# Patient Record
Sex: Female | Born: 1980
Health system: Southern US, Community
[De-identification: ages and names within clinical notes are randomized; demographics above are authoritative.]

## PROBLEM LIST (undated history)

## (undated) DIAGNOSIS — I10 Essential (primary) hypertension: Secondary | ICD-10-CM

## (undated) DIAGNOSIS — B019 Varicella without complication: Secondary | ICD-10-CM

## (undated) DIAGNOSIS — F419 Anxiety disorder, unspecified: Secondary | ICD-10-CM

## (undated) DIAGNOSIS — K219 Gastro-esophageal reflux disease without esophagitis: Secondary | ICD-10-CM

## (undated) DIAGNOSIS — G43909 Migraine, unspecified, not intractable, without status migrainosus: Secondary | ICD-10-CM

## (undated) DIAGNOSIS — F32A Depression, unspecified: Secondary | ICD-10-CM

## (undated) DIAGNOSIS — F329 Major depressive disorder, single episode, unspecified: Secondary | ICD-10-CM

## (undated) DIAGNOSIS — R51 Headache: Secondary | ICD-10-CM

## (undated) DIAGNOSIS — R519 Headache, unspecified: Secondary | ICD-10-CM

## (undated) DIAGNOSIS — T7840XA Allergy, unspecified, initial encounter: Secondary | ICD-10-CM

## (undated) DIAGNOSIS — M797 Fibromyalgia: Secondary | ICD-10-CM

## (undated) HISTORY — DX: Migraine, unspecified, not intractable, without status migrainosus: G43.909

## (undated) HISTORY — DX: Varicella without complication: B01.9

## (undated) HISTORY — DX: Major depressive disorder, single episode, unspecified: F32.9

## (undated) HISTORY — DX: Headache: R51

## (undated) HISTORY — DX: Headache, unspecified: R51.9

## (undated) HISTORY — DX: Fibromyalgia: M79.7

## (undated) HISTORY — DX: Depression, unspecified: F32.A

## (undated) HISTORY — DX: Allergy, unspecified, initial encounter: T78.40XA

---

## 2007-03-30 LAB — HM PAP SMEAR: HM PAP: NORMAL

## 2007-05-04 ENCOUNTER — Emergency Department: Payer: Self-pay | Admitting: Emergency Medicine

## 2008-02-28 ENCOUNTER — Observation Stay: Payer: Self-pay | Admitting: Obstetrics and Gynecology

## 2008-06-30 ENCOUNTER — Ambulatory Visit: Payer: Self-pay | Admitting: Obstetrics and Gynecology

## 2008-07-03 ENCOUNTER — Inpatient Hospital Stay: Payer: Self-pay

## 2009-03-31 ENCOUNTER — Emergency Department: Payer: Self-pay | Admitting: Emergency Medicine

## 2013-03-03 ENCOUNTER — Emergency Department: Payer: Self-pay | Admitting: Emergency Medicine

## 2013-03-06 ENCOUNTER — Emergency Department: Payer: Self-pay | Admitting: Emergency Medicine

## 2013-03-10 ENCOUNTER — Emergency Department: Payer: Self-pay

## 2013-03-17 ENCOUNTER — Emergency Department: Payer: Self-pay | Admitting: Emergency Medicine

## 2015-03-30 ENCOUNTER — Ambulatory Visit (INDEPENDENT_AMBULATORY_CARE_PROVIDER_SITE_OTHER): Payer: BLUE CROSS/BLUE SHIELD | Admitting: Nurse Practitioner

## 2015-03-30 ENCOUNTER — Encounter: Payer: Self-pay | Admitting: Nurse Practitioner

## 2015-03-30 VITALS — BP 124/68 | HR 93 | Temp 98.1°F | Resp 14 | Ht 63.75 in | Wt 228.8 lb

## 2015-03-30 DIAGNOSIS — R519 Headache, unspecified: Secondary | ICD-10-CM | POA: Insufficient documentation

## 2015-03-30 DIAGNOSIS — F418 Other specified anxiety disorders: Secondary | ICD-10-CM | POA: Insufficient documentation

## 2015-03-30 DIAGNOSIS — G43809 Other migraine, not intractable, without status migrainosus: Secondary | ICD-10-CM | POA: Diagnosis not present

## 2015-03-30 DIAGNOSIS — J019 Acute sinusitis, unspecified: Secondary | ICD-10-CM | POA: Insufficient documentation

## 2015-03-30 DIAGNOSIS — R51 Headache: Secondary | ICD-10-CM | POA: Diagnosis not present

## 2015-03-30 DIAGNOSIS — J0110 Acute frontal sinusitis, unspecified: Secondary | ICD-10-CM | POA: Diagnosis not present

## 2015-03-30 DIAGNOSIS — Z7689 Persons encountering health services in other specified circumstances: Secondary | ICD-10-CM | POA: Insufficient documentation

## 2015-03-30 DIAGNOSIS — J302 Other seasonal allergic rhinitis: Secondary | ICD-10-CM | POA: Insufficient documentation

## 2015-03-30 DIAGNOSIS — Z7189 Other specified counseling: Secondary | ICD-10-CM | POA: Diagnosis not present

## 2015-03-30 DIAGNOSIS — G43909 Migraine, unspecified, not intractable, without status migrainosus: Secondary | ICD-10-CM | POA: Insufficient documentation

## 2015-03-30 DIAGNOSIS — F329 Major depressive disorder, single episode, unspecified: Secondary | ICD-10-CM

## 2015-03-30 DIAGNOSIS — F32A Depression, unspecified: Secondary | ICD-10-CM

## 2015-03-30 MED ORDER — METHYLPREDNISOLONE 4 MG PO TABS: ORAL_TABLET | ORAL | Status: DC

## 2015-03-30 NOTE — Assessment & Plan Note (Signed)
Stable. Pt reports she takes medication as needed like flonase during flares. Will follow.

## 2015-03-30 NOTE — Assessment & Plan Note (Signed)
2 months of symptoms. Pt has been treated twice for OME right and left ear. Prednisone taper sent to pharmacy. Instructions on use and what not to take with it given to pt verbally and on AVS. FU prn worsening/failure to improve.

## 2015-03-30 NOTE — Assessment & Plan Note (Signed)
Stable currently without medication. Pt practices mindfulness. Has taken Effexor in past. Will follow.

## 2015-03-30 NOTE — Progress Notes (Signed)
Subjective:    Patient ID: Tiffany Jackson, female    DOB: 1981/04/03, 34 y.o.   MRN: [id number]  HPI  Tiffany Jackson is a 35 yo female establishing care today and CC of sinus issues x 2 months.   1) New pt info:   Immunizations- tdap 2008  Pap- 2008- Needs updated   Eye Exam- 2015, wears glasses   Dental Exam- 2 weeks ago for tooth pulling   LMP- 2 months ago, irregular, tubal ligation 6 years ago   2) Chronic Problems-  Depression/Anxiety- Effexor in the past   Headaches- twice a week, take OTC medication which is helpful   Migraines- family history, monthly, photophobia, usually lasts around 1 day   Allergies- Loratadine 10 mg- not helpful     3) Acute Problems- ER in [place]- chest pain last week, EKG, blood work all negative  [facility]- OME right ear- abx, went back and got it in the left ear    2.5-3 weeks ago this occurred  Sinuses- swimmy headed, pressure, queasy   Sudafed  Review of Systems  Constitutional: Negative for fever, chills, diaphoresis and fatigue.  HENT: Positive for ear pain, postnasal drip and sinus pressure. Negative for congestion, ear discharge, rhinorrhea, sore throat, tinnitus and trouble swallowing.   Respiratory: Positive for cough. Negative for chest tightness and wheezing.   Cardiovascular: Negative for chest pain, palpitations and leg swelling.  Gastrointestinal: Positive for nausea. Negative for vomiting and diarrhea.       Nausea from drainage  Skin: Negative for rash.  Neurological: Positive for light-headedness and headaches. Negative for dizziness.   Past Medical History  Diagnosis Date  . Depression   . Allergy     Seasonal  . Chicken pox   . Frequent headaches   . Migraines     History   Social History  . Marital Status: Married    Spouse Name: N/A  . Number of Children: N/A  . Years of Education: N/A   Occupational History  . Not on file.   Social History Main Topics  . Smoking status: Former Smoker    Types: Cigarettes    Quit  date: 07/02/2013  . Smokeless tobacco: Never Used  . Alcohol Use: No  . Drug Use: No  . Sexual Activity:    Partners: Male    Birth Control/ Protection: Surgical     Comment: Husband   Other Topics Concern  . 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 Cesarean section      2003, 2005, 2009    Family History  Problem Relation Age of Onset  . Multiple sclerosis Mother   . Hypertension Father   . Cancer Maternal Grandmother     Breast Cancer  . Cancer Paternal Grandfather     Lung Cancer  . Heart disease Daughter     No Known Allergies  No current outpatient prescriptions on file prior to visit.   No current facility-administered medications on file prior to visit.      Objective:   Physical Exam  Constitutional: She is oriented to person, place, and time. She appears well-developed and well-nourished. No distress.  BP 124/68 mmHg  Pulse 93  Temp(Src) 98.1  F (36.7 C)  Resp 14  Ht 5' 3.75" (1.619 m)  Wt 228 lb 12.8 oz (103.783 kg)  BMI 39.59 kg/m2  SpO2 99%    HENT:  Head: Normocephalic and atraumatic.  Right Ear: External ear normal.  Left Ear: External ear normal.  Mouth/Throat: No oropharyngeal exudate.  TM's bulging and injected bilaterally, Landmarks visible  Eyes: EOM are normal. Pupils are equal, round, and reactive to light. Right eye exhibits no discharge. Left eye exhibits no discharge. No scleral icterus.  Neck: Normal range of motion. Neck supple. No thyromegaly present.  Cardiovascular: Normal rate, regular rhythm and normal heart sounds.   Pulmonary/Chest: Effort normal and breath sounds normal.  Lymphadenopathy:    She has no cervical adenopathy.  Neurological: She is alert and oriented to person, place, and time.  Skin: Skin is warm  and dry. No rash noted. She is not diaphoretic.  Psychiatric: She has a normal mood and affect. Her behavior is normal. Judgment and thought content normal.      Assessment & Plan:

## 2015-03-30 NOTE — Assessment & Plan Note (Signed)
Mild headaches 2-3 x a week that resolve with OTC treatment.

## 2015-03-30 NOTE — Assessment & Plan Note (Addendum)
Discussed acute and chronic issues. Reviewed health maintenance measures, PFSHx, and immunizations. Obtain records from previous facility  Pt needs physical with updated GYN exam

## 2015-03-30 NOTE — Assessment & Plan Note (Signed)
Family history, pt also has at least once monthly. She gets photophobia and it usually lasts for 24 hours. Rest and NSAIDs helpful.

## 2015-03-30 NOTE — Patient Instructions (Signed)
Prednisone with breakfast  6 tablets on day 1, 5 tablets on day 2, 4 tablets on day 3, 3 tablets on day 4, 2 tablets day 5, 1 tablet on day 6...done!   No NSAIDs (ibuprofen ect...)   Call us next week if not helpful.

## 2015-03-30 NOTE — Progress Notes (Signed)
Pre visit review using our clinic review tool, if applicable. No additional management support is needed unless otherwise documented below in the visit note. 

## 2015-03-31 ENCOUNTER — Emergency Department
Admission: EM | Admit: 2015-03-31 | Discharge: 2015-04-01 | Disposition: A | Discharge status: Home / Self Care | Payer: BLUE CROSS/BLUE SHIELD | Attending: Emergency Medicine | Admitting: Emergency Medicine

## 2015-03-31 ENCOUNTER — Encounter: Payer: Self-pay | Admitting: Emergency Medicine

## 2015-03-31 ENCOUNTER — Emergency Department: Payer: BLUE CROSS/BLUE SHIELD

## 2015-03-31 DIAGNOSIS — Z7952 Long term (current) use of systemic steroids: Secondary | ICD-10-CM | POA: Diagnosis not present

## 2015-03-31 DIAGNOSIS — Z87891 Personal history of nicotine dependence: Secondary | ICD-10-CM | POA: Insufficient documentation

## 2015-03-31 DIAGNOSIS — R000 Tachycardia, unspecified: Secondary | ICD-10-CM | POA: Diagnosis not present

## 2015-03-31 DIAGNOSIS — R079 Chest pain, unspecified: Secondary | ICD-10-CM | POA: Diagnosis present

## 2015-03-31 DIAGNOSIS — R002 Palpitations: Secondary | ICD-10-CM | POA: Diagnosis not present

## 2015-03-31 LAB — BASIC METABOLIC PANEL
ANION GAP: 12 (ref 5–15)
BUN: 7 mg/dL (ref 6–20)
CHLORIDE: 106 mmol/L (ref 101–111)
CO2: 20 mmol/L — AB (ref 22–32)
Calcium: 9.7 mg/dL (ref 8.9–10.3)
Creatinine, Ser: 0.54 mg/dL (ref 0.44–1.00)
GFR calc non Af Amer: >60 mL/min (ref >60)
Glucose, Bld: 127 mg/dL — ABNORMAL HIGH (ref 65–99)
POTASSIUM: 3.7 mmol/L (ref 3.5–5.1)
SODIUM: 138 mmol/L (ref 135–145)

## 2015-03-31 LAB — CBC WITH DIFFERENTIAL/PLATELET
BASOS PCT: 0 %
Basophils Absolute: 0 10*3/uL (ref 0–0.1)
Eosinophils Absolute: 0 10*3/uL (ref 0–0.7)
Eosinophils Relative: 0 %
HCT: 45.3 % (ref 35.0–47.0)
Hemoglobin: 14.8 g/dL (ref 12.0–16.0)
Lymphocytes Relative: 15 %
Lymphs Abs: 1.9 10*3/uL (ref 1.0–3.6)
MCH: 29.5 pg (ref 26.0–34.0)
MCHC: 32.7 g/dL (ref 32.0–36.0)
MCV: 90.1 fL (ref 80.0–100.0)
MONO ABS: 0.4 10*3/uL (ref 0.2–0.9)
Monocytes Relative: 3 %
NEUTROS ABS: 10.5 10*3/uL — AB (ref 1.4–6.5)
Neutrophils Relative %: 82 %
Platelets: 360 10*3/uL (ref 150–440)
RBC: 5.03 MIL/uL (ref 3.80–5.20)
RDW: 13.3 % (ref 11.5–14.5)
WBC: 12.9 10*3/uL — ABNORMAL HIGH (ref 3.6–11.0)

## 2015-03-31 LAB — FIBRIN DERIVATIVES D-DIMER (ARMC ONLY): FIBRIN DERIVATIVES D-DIMER (ARMC): 149.96 (ref 0–499)

## 2015-03-31 LAB — TROPONIN I: Troponin I: <0.03 ng/mL (ref <0.031)

## 2015-03-31 MED ORDER — SODIUM CHLORIDE 0.9 % IV BOLUS (SEPSIS)
1000.0000 mL | Freq: Once | INTRAVENOUS | Status: AC
  Administered 2015-03-31: 1000 mL via INTRAVENOUS

## 2015-03-31 MED ORDER — LORAZEPAM 2 MG/ML IJ SOLN
1.0000 mg | Freq: Once | INTRAMUSCULAR | Status: AC
  Administered 2015-03-31: 1 mg via INTRAVENOUS
  Filled 2015-03-31: qty 1

## 2015-03-31 NOTE — ED Provider Notes (Signed)
[facility] Emergency Department Provider Note  ____________________________________________  Time seen: Approximately 840 PM  I have reviewed the triage vital signs and the nursing notes.   HISTORY  Chief Complaint Chest Pain and Palpitations    HPI Tiffany Jackson is a 34 y.o. female with a history of sinusitis and gastritis who presents today with palpitations and intermittent chest pain since 2 PM. She said that she has had a sinus infection over the past several months. She's undergone several courses of antibiotics including Augmentin. She is no longer on the medication. She was changed to methylprednisolone this morning. She took her first dose this morning and then at 2 PM began having palpitations and intermittent chest pain. She says the chest pain is sharp and lasts less than a second. She is not claiming any chest pain at this time. However, she does feel the palpitations. No shortness of breath. Does say that she has ongoing diarrhea which she has had prior to the antibiotics. She says that she has 3 loose stools per day. No blood in her stool.   Past Medical History  Diagnosis Date  . Depression   . Allergy     Seasonal  . Chicken pox   . Frequent headaches   . Migraines     Patient Active Problem List   Diagnosis Date Noted  . Encounter to establish care 03/30/2015  . Depression 03/30/2015  . Frequent headaches 03/30/2015  . Migraines 03/30/2015  . Seasonal allergies 03/30/2015  . Sinusitis, acute 03/30/2015    Past Surgical History  Procedure Laterality Date  . Cesarean section      2003, 2005, 2009    Current Outpatient Rx  Name  Route  Sig  Dispense  Refill  . methylPREDNISolone (MEDROL) 4 MG tablet      Take 6 tablets by mouth with breakfast or lunch and decrease by 1 tablet each day until gone.   21 tablet   0     Allergies Review of patient's allergies indicates no known allergies.  Family History  Problem Relation  Age of Onset  . Multiple sclerosis Mother   . Hypertension Father   . Cancer Maternal Grandmother     Breast Cancer  . Cancer Paternal Grandfather     Lung Cancer  . Heart disease Daughter     Social History History  Substance Use Topics  . Smoking status: Former Smoker    Types: Cigarettes    Quit date: 07/02/2013  . Smokeless tobacco: Never Used  . Alcohol Use: No    Review of Systems Constitutional: No fever/chills Eyes: No visual changes. ENT: No sore throat. Cardiovascular: As above  Respiratory: Denies shortness of breath. Gastrointestinal: No abdominal pain.  No nausea, no vomiting.  No diarrhea.  No constipation. Genitourinary: Negative for dysuria. Musculoskeletal: Negative for back pain. Skin: Negative for rash. Neurological: Negative for headaches, focal weakness or numbness.  10-point ROS otherwise negative.  ____________________________________________   PHYSICAL EXAM:  VITAL SIGNS: ED Triage Vitals  Enc Vitals Group     BP 03/31/15 2032 137/90 mmHg     Pulse Rate 03/31/15 2015 150     Resp 03/31/15 2015 22     Temp --      Temp src --      SpO2 03/31/15 2015 100 %     Weight 03/31/15 2015 230 lb (104.327 kg)     Height 03/31/15 2015 5\' 3"  (1.6 m)     Head Cir --  Peak Flow --      Pain Score 03/31/15 2016 1     Pain Loc --      Pain Edu? --      Excl. in GC? --     Constitutional: Alert and oriented. Well appearing and in no acute distress. Eyes: Conjunctivae are normal. PERRL. EOMI. Head: Atraumatic. Nose: No congestion/rhinnorhea. Mouth/Throat: Mucous membranes are moist.  Oropharynx non-erythematous. Neck: No stridor.   Cardiovascular: Tachycardic, regular rhythm. Grossly normal heart sounds.  Good peripheral circulation. Respiratory: Normal respiratory effort.  No retractions. Lungs CTAB. Gastrointestinal: Soft and nontender. No distention. No abdominal bruits. No CVA tenderness. Musculoskeletal: No lower extremity tenderness  nor edema.  No joint effusions. Neurologic:  Normal speech and language. No gross focal neurologic deficits are appreciated. No gait instability. Skin:  Skin is warm, dry and intact. No rash noted. Psychiatric: Mood and affect are normal. Speech and behavior are normal.  ____________________________________________   LABS (all labs ordered are listed, but only abnormal results are displayed)  Labs Reviewed  CBC WITH DIFFERENTIAL/PLATELET - Abnormal; Notable for the following:    WBC 12.9 (*)    Neutro Abs 10.5 (*)    All other components within normal limits  BASIC METABOLIC PANEL  TROPONIN I  FIBRIN DERIVATIVES D-DIMER ([facility] ONLY)   ____________________________________________  EKG  ED ECG REPORT I, Arelia Longest, the attending physician, personally viewed and interpreted this ECG.   Date: 03/31/2015  EKG Time: 2020  Rate: 143  Rhythm: sinus tachycardia  Axis: Normal axis  Intervals:none  ST&T Change: Diffuse mild ST depressions which are likely rate related.  ____________________________________________  RADIOLOGY  No active disease on the chest x-ray. I personally reviewed these images. ____________________________________________   PROCEDURES    ____________________________________________   INITIAL IMPRESSION / ASSESSMENT AND PLAN / ED COURSE  Pertinent labs & imaging results that were available during my care of the patient were reviewed by me and considered in my medical decision making (see chart for details).  ----------------------------------------- 10:07 PM on 03/31/2015 -----------------------------------------  Patient heart rate down to 112 with fluids and Ativan. Pending lab results. Signed out to Dr. Fanny Bien.  Pending d-dimer for further risk stratification for etiology of heart rate and chest pain.  If he rules out for pulmonary embolus. Plan will be to DC methylprednisolone. ____________________________________________   FINAL  CLINICAL IMPRESSION(S) / ED DIAGNOSES  Acute chest pain with tachycardia. Initial visit.    Myrna Blazer, MD 03/31/15 [phone]

## 2015-03-31 NOTE — ED Notes (Signed)
Pt. States she was started on steroids this afternoon for sinus congestion.   Pt. States she took her first dose today. Pt. States she had palpations last week.  Pt. States having blood work drawn and EKG done, results were negative.

## 2015-03-31 NOTE — ED Provider Notes (Signed)
Patient heart rate is improving with fluids and Ativan. Signed out to Dr. Derrill Kay. D-dimer less than 500. Chest x-ray clear. IPlan will be to DC methylprednisolone and discharge once heart rate improved. Of note patient does have leukocytosis, but is on methylprednisolone which is a likely cause. Afebrile. No distress.  Sharyn Creamer, MD 03/31/15 [phone]

## 2015-03-31 NOTE — ED Notes (Signed)
Pt presents to ER alert and in NAD. Pt has hx of GERD and reports having palpitations a few hours ago and CP that was relieved with zantac. Recently seen at PCP and diagnosed with gastritis.

## 2015-04-01 NOTE — Discharge Instructions (Signed)
Please stop your steroids. As discussed it is important to follow up with her primary care doctor about your fast heart rate, it may be that he would benefit from being put on a medication to help control the rate of your heart.Please seek medical attention for any high fevers, chest pain, shortness of breath, change in behavior, persistent vomiting, bloody stool or any other new or concerning symptoms. Nonspecific Tachycardia Tachycardia is a faster than normal heartbeat (more than 100 beats per minute). In adults, the heart normally beats between 60 and 100 times a minute. A fast heartbeat may be a normal response to exercise or stress. It does not necessarily mean that something is wrong. However, sometimes when your heart beats too fast it may not be able to pump enough blood to the rest of your body. This can result in chest pain, shortness of breath, dizziness, and even fainting. Nonspecific tachycardia means that the specific cause or pattern of your tachycardia is unknown. CAUSES  Tachycardia may be harmless or it may be due to a more serious underlying cause. Possible causes of tachycardia include:  Exercise or exertion.  Fever.  Pain or injury.  Infection.  Loss of body fluids (dehydration).  Overactive thyroid.  Lack of red blood cells (anemia).  Anxiety and stress.  Alcohol.  Caffeine.  Tobacco products.  Diet pills.  Illegal drugs.  Heart disease. SYMPTOMS  Rapid or irregular heartbeat (palpitations).  Suddenly feeling your heart beating (cardiac awareness).  Dizziness.  Tiredness (fatigue).  Shortness of breath.  Chest pain.  Nausea.  Fainting. DIAGNOSIS  Your caregiver will perform a physical exam and take your medical history. In some cases, a heart specialist (cardiologist) may be consulted. Your caregiver may also order:  Blood tests.  Electrocardiography. This test records the electrical activity of your heart.  A heart monitoring  test. TREATMENT  Treatment will depend on the likely cause of your tachycardia. The goal is to treat the underlying cause of your tachycardia. Treatment methods may include:  Replacement of fluids or blood through an intravenous (IV) tube for moderate to severe dehydration or anemia.  New medicines or changes in your current medicines.  Diet and lifestyle changes.  Treatment for certain infections.  Stress relief or relaxation methods. HOME CARE INSTRUCTIONS   Rest.  Drink enough fluids to keep your urine clear or pale yellow.  Do not smoke.  Avoid:  Caffeine.  Tobacco.  Alcohol.  Chocolate.  Stimulants such as over-the-counter diet pills or pills that help you stay awake.  Situations that cause anxiety or stress.  Illegal drugs such as marijuana, phencyclidine (PCP), and cocaine.  Only take medicine as directed by your caregiver.  Keep all follow-up appointments as directed by your caregiver. SEEK IMMEDIATE MEDICAL CARE IF:   You have pain in your chest, upper arms, jaw, or neck.  You become weak, dizzy, or feel faint.  You have palpitations that will not go away.  You vomit, have diarrhea, or pass blood in your stool.  Your skin is cool, pale, and wet.  You have a fever that will not go away with rest, fluids, and medicine. MAKE SURE YOU:   Understand these instructions.  Will watch your condition.  Will get help right away if you are not doing well or get worse. Document Released: 09/25/2004 Document Revised: 11/10/2011 Document Reviewed: 07/29/2011 [facility] Patient Information 2015 [place], [place]. This information is not intended to replace advice given to you by your health care provider. Make sure  you discuss any questions you have with your health care provider.

## 2015-04-01 NOTE — ED Provider Notes (Signed)
-----------------------------------------   2:04 AM on 04/01/2015 -----------------------------------------  Patient's heart rate still in the 110's however this is an improvement over her initial heart rate. Patient herself does not have any complaints currently. I did have a discussion with the patient. She states her heart rate does normally run fast. She says it is not unusual for her heart rate to be in the 110s. Blood work and chest x-ray here without any obvious etiology of the fast heart rate. At this point feel patient can be discharged given history of tachycardia and improvement. I did discuss with patient return precautions as well as importance of following up with her primary care doctor for possible rate control for heart rate is usually this fast.  Phineas Semen, MD 04/01/15 [phone]

## 2015-04-01 NOTE — ED Notes (Signed)
Pt. Going home with family. 

## 2015-04-03 ENCOUNTER — Ambulatory Visit (INDEPENDENT_AMBULATORY_CARE_PROVIDER_SITE_OTHER): Payer: BLUE CROSS/BLUE SHIELD | Admitting: Nurse Practitioner

## 2015-04-03 ENCOUNTER — Ambulatory Visit
Admission: RE | Admit: 2015-04-03 | Discharge: 2015-04-03 | Disposition: A | Discharge status: Home / Self Care | Payer: BLUE CROSS/BLUE SHIELD | Source: Ambulatory Visit | Attending: Nurse Practitioner | Admitting: Nurse Practitioner

## 2015-04-03 ENCOUNTER — Encounter: Payer: Self-pay | Admitting: Medical Oncology

## 2015-04-03 ENCOUNTER — Encounter: Payer: Self-pay | Admitting: Nurse Practitioner

## 2015-04-03 ENCOUNTER — Inpatient Hospital Stay
Admission: EM | Admit: 2015-04-03 | Discharge: 2015-04-05 | DRG: 419 | Disposition: A | Discharge status: Home / Self Care | Payer: BLUE CROSS/BLUE SHIELD | Attending: Surgery | Admitting: Surgery

## 2015-04-03 VITALS — BP 156/90 | HR 112 | Temp 97.9°F | Resp 14 | Ht 63.75 in | Wt 225.4 lb

## 2015-04-03 DIAGNOSIS — R10811 Right upper quadrant abdominal tenderness: Secondary | ICD-10-CM

## 2015-04-03 DIAGNOSIS — Z87891 Personal history of nicotine dependence: Secondary | ICD-10-CM

## 2015-04-03 DIAGNOSIS — Z820 Family history of epilepsy and other diseases of the nervous system: Secondary | ICD-10-CM

## 2015-04-03 DIAGNOSIS — R112 Nausea with vomiting, unspecified: Secondary | ICD-10-CM

## 2015-04-03 DIAGNOSIS — K660 Peritoneal adhesions (postprocedural) (postinfection): Secondary | ICD-10-CM | POA: Diagnosis present

## 2015-04-03 DIAGNOSIS — Z803 Family history of malignant neoplasm of breast: Secondary | ICD-10-CM | POA: Diagnosis not present

## 2015-04-03 DIAGNOSIS — Z8249 Family history of ischemic heart disease and other diseases of the circulatory system: Secondary | ICD-10-CM

## 2015-04-03 DIAGNOSIS — K8020 Calculus of gallbladder without cholecystitis without obstruction: Secondary | ICD-10-CM

## 2015-04-03 DIAGNOSIS — R197 Diarrhea, unspecified: Secondary | ICD-10-CM | POA: Diagnosis not present

## 2015-04-03 DIAGNOSIS — K810 Acute cholecystitis: Secondary | ICD-10-CM | POA: Diagnosis present

## 2015-04-03 DIAGNOSIS — K819 Cholecystitis, unspecified: Secondary | ICD-10-CM

## 2015-04-03 DIAGNOSIS — Z801 Family history of malignant neoplasm of trachea, bronchus and lung: Secondary | ICD-10-CM | POA: Diagnosis not present

## 2015-04-03 DIAGNOSIS — R0602 Shortness of breath: Secondary | ICD-10-CM

## 2015-04-03 LAB — COMPREHENSIVE METABOLIC PANEL
ALK PHOS: 83 U/L (ref 38–126)
ALT: 37 U/L (ref 14–54)
AST: 21 U/L (ref 15–41)
Albumin: 4.6 g/dL (ref 3.5–5.0)
Anion gap: 13 (ref 5–15)
BUN: 6 mg/dL (ref 6–20)
CHLORIDE: 102 mmol/L (ref 101–111)
CO2: 20 mmol/L — AB (ref 22–32)
Calcium: 9 mg/dL (ref 8.9–10.3)
Creatinine, Ser: 0.71 mg/dL (ref 0.44–1.00)
GFR calc non Af Amer: >60 mL/min (ref >60)
Glucose, Bld: 105 mg/dL — ABNORMAL HIGH (ref 65–99)
POTASSIUM: 3.5 mmol/L (ref 3.5–5.1)
Sodium: 135 mmol/L (ref 135–145)
TOTAL PROTEIN: 8.3 g/dL — AB (ref 6.5–8.1)
Total Bilirubin: 0.5 mg/dL (ref 0.3–1.2)

## 2015-04-03 LAB — CBC
HCT: 44.7 % (ref 35.0–47.0)
Hemoglobin: 15.2 g/dL (ref 12.0–16.0)
MCH: 30.1 pg (ref 26.0–34.0)
MCHC: 33.9 g/dL (ref 32.0–36.0)
MCV: 88.7 fL (ref 80.0–100.0)
Platelets: 328 10*3/uL (ref 150–440)
RBC: 5.03 MIL/uL (ref 3.80–5.20)
RDW: 12.9 % (ref 11.5–14.5)
WBC: 12.4 10*3/uL — AB (ref 3.6–11.0)

## 2015-04-03 LAB — CBC WITH DIFFERENTIAL/PLATELET
BASOS ABS: 0 10*3/uL (ref 0.0–0.1)
Basophils Relative: 0.4 % (ref 0.0–3.0)
Eosinophils Absolute: 0 10*3/uL (ref 0.0–0.7)
Eosinophils Relative: 0.3 % (ref 0.0–5.0)
HCT: 43.1 % (ref 36.0–46.0)
HEMOGLOBIN: 14.7 g/dL (ref 12.0–15.0)
LYMPHS ABS: 2.1 10*3/uL (ref 0.7–4.0)
LYMPHS PCT: 21.8 % (ref 12.0–46.0)
MCHC: 34 g/dL (ref 30.0–36.0)
MCV: 90.3 fl (ref 78.0–100.0)
MONO ABS: 0.4 10*3/uL (ref 0.1–1.0)
Monocytes Relative: 4 % (ref 3.0–12.0)
Neutro Abs: 7.2 10*3/uL (ref 1.4–7.7)
Neutrophils Relative %: 73.5 % (ref 43.0–77.0)
PLATELETS: 366 10*3/uL (ref 150.0–400.0)
RBC: 4.77 Mil/uL (ref 3.87–5.11)
RDW: 13.1 % (ref 11.5–15.5)
WBC: 9.9 10*3/uL (ref 4.0–10.5)

## 2015-04-03 LAB — HEPATIC FUNCTION PANEL
ALT: 31 U/L (ref 0–35)
AST: 13 U/L (ref 0–37)
Albumin: 4.4 g/dL (ref 3.5–5.2)
Alkaline Phosphatase: 78 U/L (ref 39–117)
Bilirubin, Direct: 0.1 mg/dL (ref 0.0–0.3)
Total Bilirubin: 0.6 mg/dL (ref 0.2–1.2)
Total Protein: 7.7 g/dL (ref 6.0–8.3)

## 2015-04-03 LAB — LIPASE: LIPASE: 12 U/L (ref 11.0–59.0)

## 2015-04-03 LAB — LIPASE, BLOOD: Lipase: 15 U/L — ABNORMAL LOW (ref 22–51)

## 2015-04-03 MED ORDER — LACTATED RINGERS IV SOLN
INTRAVENOUS | Status: DC
  Administered 2015-04-03 – 2015-04-04 (×3): via INTRAVENOUS

## 2015-04-03 MED ORDER — ONDANSETRON HCL 4 MG/2ML IJ SOLN
4.0000 mg | Freq: Four times a day (QID) | INTRAMUSCULAR | Status: DC | PRN
  Administered 2015-04-04 (×2): 4 mg via INTRAVENOUS
  Filled 2015-04-03: qty 2

## 2015-04-03 MED ORDER — CIPROFLOXACIN IN D5W 400 MG/200ML IV SOLN
400.0000 mg | Freq: Once | INTRAVENOUS | Status: AC
  Administered 2015-04-03: 400 mg via INTRAVENOUS
  Filled 2015-04-03: qty 200

## 2015-04-03 MED ORDER — HEPARIN SODIUM (PORCINE) 5000 UNIT/ML IJ SOLN
5000.0000 [IU] | Freq: Three times a day (TID) | INTRAMUSCULAR | Status: DC
  Administered 2015-04-03: 5000 [IU] via SUBCUTANEOUS
  Filled 2015-04-03: qty 1

## 2015-04-03 MED ORDER — MORPHINE SULFATE 2 MG/ML IJ SOLN
2.0000 mg | INTRAMUSCULAR | Status: DC | PRN
  Administered 2015-04-04 (×2): 2 mg via INTRAVENOUS
  Filled 2015-04-03 (×2): qty 1

## 2015-04-03 MED ORDER — CEFAZOLIN SODIUM 1-5 GM-% IV SOLN
1.0000 g | Freq: Four times a day (QID) | INTRAVENOUS | Status: DC
  Administered 2015-04-03 – 2015-04-04 (×3): 1 g via INTRAVENOUS
  Filled 2015-04-03 (×8): qty 50

## 2015-04-03 MED ORDER — SODIUM CHLORIDE 0.9 % IV BOLUS (SEPSIS)
1000.0000 mL | Freq: Once | INTRAVENOUS | Status: AC
  Administered 2015-04-03: 1000 mL via INTRAVENOUS

## 2015-04-03 MED ORDER — METRONIDAZOLE IN NACL 5-0.79 MG/ML-% IV SOLN
500.0000 mg | Freq: Once | INTRAVENOUS | Status: AC
  Administered 2015-04-03: 500 mg via INTRAVENOUS
  Filled 2015-04-03: qty 100

## 2015-04-03 MED ORDER — ONDANSETRON HCL 4 MG PO TABS: 4.0000 mg | ORAL_TABLET | Freq: Three times a day (TID) | ORAL | Status: DC | PRN

## 2015-04-03 MED ORDER — HYDROCODONE-ACETAMINOPHEN 5-325 MG PO TABS
1.0000 | ORAL_TABLET | ORAL | Status: DC | PRN
Start: 2015-04-03 — End: 2015-04-04
  Administered 2015-04-03: 1 via ORAL
  Filled 2015-04-03: qty 1

## 2015-04-03 MED ORDER — ONDANSETRON HCL 4 MG PO TABS: 4.0000 mg | ORAL_TABLET | Freq: Four times a day (QID) | ORAL | Status: DC | PRN

## 2015-04-03 NOTE — Progress Notes (Signed)
Subjective:    Patient ID: Tiffany Jackson, female    DOB: [birth year], 34 y.o.   MRN: [id number]  HPI  Tiffany Jackson is a 34 yo female with a CC of ER visit and CC of N/V/D.   1) Tiffany Jackson went to ED on Saturday with chest pain and palpitations at [facility].  Tiffany Jackson had leukocytosis and EKG that showed tachycardia with diffuse mild ST depressions that they were not concerned about due to normal troponin and D-dimer.   Chest/back hurts, yellow loose stools "constantly" Tiffany Jackson reports. Tiffany Jackson feels a pressure in the epigastric area when sitting up. Tiffany Jackson reports the best position is lying and in the fetal position.   Vomiting this morning even after stopping prednisone taper on Sat.  Nausea is fairly constant. Tiffany Jackson is currently menstruating, reports even the smell of food is nauseating. Ate a piece of banana at 7 am and then vomited and has not tried eating food since.   Pepto- Not helpful  Rest- Helpful   Review of Systems  Constitutional: Negative for fever, chills, diaphoresis and fatigue.  Respiratory: Negative for chest tightness, shortness of breath and wheezing.   Cardiovascular: Negative for chest pain, palpitations and leg swelling.  Gastrointestinal: Positive for nausea, vomiting, abdominal pain, diarrhea and abdominal distention. Negative for blood in stool and anal bleeding.  Skin: Negative for rash.  Neurological: Positive for light-headedness. Negative for dizziness, weakness, numbness and headaches.  Psychiatric/Behavioral: The patient is not nervous/anxious.    Past Medical History  Diagnosis Date  . Depression   . Allergy     Seasonal  . Chicken pox   . Frequent headaches   . Migraines     History   Social History  . Marital Status: Married    Spouse Name: N/A  . Number of Children: N/A  . Years of Education: N/A   Occupational History  . Not on file.   Social History Main Topics  . Smoking status: Former Smoker    Types: Cigarettes    Quit date: 07/02/2013  . Smokeless  tobacco: Never Used  . Alcohol Use: No  . Drug Use: No  . Sexual Activity:    Partners: Male    Birth Control/ Protection: Surgical     Comment: Husband   Other Topics Concern  . 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 Cesarean section      2003, 2005, 2009    Family History  Problem Relation Age of Onset  . Multiple sclerosis Mother   . Hypertension Father   . Cancer Maternal Grandmother     Breast Cancer  . Cancer Paternal Grandfather     Lung Cancer  . Heart disease Daughter     No Known Allergies  Current Outpatient Prescriptions on File Prior to Visit  Medication Sig Dispense Refill  . methylPREDNISolone (MEDROL) 4 MG tablet Take 6 tablets by mouth with breakfast or lunch and decrease by 1 tablet each day until gone. (Patient not taking: Reported on 04/03/2015) 21 tablet 0   No current facility-administered medications on file prior to visit.      Objective:   Physical Exam  Constitutional: Tiffany Jackson is oriented to person, place, and time. Tiffany Jackson  appears well-developed and well-nourished. No distress.  BP 156/90 mmHg  Pulse 112  Temp(Src) 97.9 F (36.6 C)  Resp 14  Ht 5' 3.75" (1.619 m)  Wt 225 lb 6.4 oz (102.241 kg)  BMI 39.01 kg/m2  SpO2 99%  LMP 12/31/2014 (Approximate)   HENT:  Head: Normocephalic and atraumatic.  Right Ear: External ear normal.  Left Ear: External ear normal.  Eyes: No scleral icterus.  Cardiovascular: Normal rate, regular rhythm and normal heart sounds.  Exam reveals no gallop and no friction rub.   No murmur heard. Pulmonary/Chest: Effort normal and breath sounds normal. No respiratory distress. Tiffany Jackson has no wheezes. Tiffany Jackson has no rales. Tiffany Jackson exhibits no tenderness.  Abdominal: Soft. Bowel sounds are normal. Tiffany Jackson exhibits distension.  Tiffany Jackson exhibits no mass. There is tenderness. There is no rebound and no guarding.  Neurological: Tiffany Jackson is alert and oriented to person, place, and time. No cranial nerve deficit. Tiffany Jackson exhibits normal muscle tone. Coordination normal.  Skin: Skin is warm and dry. No rash noted. Tiffany Jackson is not diaphoretic.  Psychiatric: Tiffany Jackson has a normal mood and affect. Her behavior is normal. Judgment and thought content normal.      Assessment & Plan:

## 2015-04-03 NOTE — H&P (Signed)
Tiffany Jackson is an 34 y.o. female.    Chief Complaint: Right upper quadrant pain  HPI: This a patient with one month of recurrent and episodic right upper quadrant pain it's happening twice a day now she was sent to the emergency room for an ultrasound showing acute cholecystitis. She's had nausea and vomiting today and has had vomiting multiple times the last few days  Ears chills denies jaundice acholic stools denies weight loss no melena or hematochezia. She was seen in the emergency room at the request emergency room physician for possible admission  Also note that I had taken out her sons appendix last month.  Past Medical History  Diagnosis Date  . Depression   . Allergy     Seasonal  . Chicken pox   . Frequent headaches   . Migraines     Past Surgical History  Procedure Laterality Date  . Cesarean section      2003, 2005, 2009    Family History  Problem Relation Age of Onset  . Multiple sclerosis Mother   . Hypertension Father   . Cancer Maternal Grandmother     Breast Cancer  . Cancer Paternal Grandfather     Lung Cancer  . Heart disease Daughter    Social History:  reports that she quit smoking about 21 months ago. Her smoking use included Cigarettes. She has never used smokeless tobacco. She reports that she does not drink alcohol or use illicit drugs.  Allergies: No Known Allergies   (Not in a hospital admission)   Review of Systems  Constitutional: Negative for fever and chills.  HENT: Negative.   Eyes: Negative.   Respiratory: Negative for cough, hemoptysis and shortness of breath.   Cardiovascular: Negative for chest pain, palpitations and leg swelling.  Gastrointestinal: Positive for nausea, vomiting and abdominal pain. Negative for heartburn, diarrhea, constipation, blood in stool and melena.  Genitourinary: Negative for dysuria and urgency.  Musculoskeletal: Negative.   Skin: Negative.   Neurological: Negative.  Negative for weakness.   Endo/Heme/Allergies: Negative.   Psychiatric/Behavioral: Negative.      Physical Exam:  BP 127/79 mmHg  Pulse 111  Temp(Src) 97.8 F (36.6 C) (Oral)  Resp 20  Ht $R'5\' 3"'nR$  (1.6 m)  Wt 225 lb (102.059 kg)  BMI 39.87 kg/m2  SpO2 100%  LMP 04/03/2015 (Exact Date)  Physical Exam  Constitutional: She is oriented to person, place, and time and well-developed, well-nourished, and in no distress.  HENT:  Head: Normocephalic and atraumatic.  Eyes: No scleral icterus.  Neck: Normal range of motion. Neck supple.  Cardiovascular: Normal rate and regular rhythm.   Pulmonary/Chest: Effort normal and breath sounds normal. No respiratory distress. She has no wheezes.  Abdominal: Soft. Bowel sounds are normal. She exhibits no distension. There is tenderness. There is no rebound and no guarding.  Right upper quadrant tenderness with a questionable Murphy sign  Musculoskeletal: Normal range of motion. She exhibits no edema.  Lymphadenopathy:    She has no cervical adenopathy.  Neurological: She is alert and oriented to person, place, and time.  Skin: Skin is warm and dry.  Psychiatric: Mood, affect and judgment normal.        Results for orders placed or performed during the hospital encounter of 04/03/15 (from the past 48 hour(s))  Lipase, blood     Status: Abnormal   Collection Time: 04/03/15  3:44 PM  Result Value Ref Range   Lipase 15 (L) 22 - 51 U/L  Comprehensive metabolic  panel     Status: Abnormal   Collection Time: 04/03/15  3:44 PM  Result Value Ref Range   Sodium 135 135 - 145 mmol/L   Potassium 3.5 3.5 - 5.1 mmol/L   Chloride 102 101 - 111 mmol/L   CO2 20 (L) 22 - 32 mmol/L   Glucose, Bld 105 (H) 65 - 99 mg/dL   BUN 6 6 - 20 mg/dL   Creatinine, Ser 0.71 0.44 - 1.00 mg/dL   Calcium 9.0 8.9 - 10.3 mg/dL   Total Protein 8.3 (H) 6.5 - 8.1 g/dL   Albumin 4.6 3.5 - 5.0 g/dL   AST 21 15 - 41 U/L   ALT 37 14 - 54 U/L   Alkaline Phosphatase 83 38 - 126 U/L   Total Bilirubin  0.5 0.3 - 1.2 mg/dL   GFR calc non Af Amer >60 >60 mL/min   GFR calc Af Amer >60 >60 mL/min    Comment: (NOTE) The eGFR has been calculated using the CKD EPI equation. This calculation has not been validated in all clinical situations. eGFR's persistently <60 mL/min signify possible Chronic Kidney Disease.    Anion gap 13 5 - 15  CBC     Status: Abnormal   Collection Time: 04/03/15  3:44 PM  Result Value Ref Range   WBC 12.4 (H) 3.6 - 11.0 K/uL   RBC 5.03 3.80 - 5.20 MIL/uL   Hemoglobin 15.2 12.0 - 16.0 g/dL   HCT 44.7 35.0 - 47.0 %   MCV 88.7 80.0 - 100.0 fL   MCH 30.1 26.0 - 34.0 pg   MCHC 33.9 32.0 - 36.0 g/dL   RDW 12.9 11.5 - 14.5 %   Platelets 328 150 - 440 K/uL   US Abdomen Complete  04/03/2015   CLINICAL DATA:  Right upper quadrant abdominal pain, nausea and vomiting  EXAM: ULTRASOUND ABDOMEN COMPLETE  COMPARISON:  None.  FINDINGS: Gallbladder: The gallbladder is visualized and multiple gallstones are present within the gallbladder with strong acoustical shadowing. No gallbladder wall thickening is seen, but there is pain over the gallbladder and developing acute cholecystitis is a definite consideration.  Common bile duct: Diameter: The common bile duct is normal measuring 4.8 mm in diameter.  Liver: The liver has a normal echogenic pattern. No focal abnormality is seen.  IVC: No abnormality visualized.  Pancreas: Visualized portion unremarkable.  Spleen: The spleen is normal measuring 8.0 cm.  Right Kidney: Length: 12.7 cm.  No hydronephrosis is seen.  Left Kidney: Length: 13.4 cm.  No hydronephrosis is noted.  Abdominal aorta: The abdominal aorta is normal caliber.  Other findings: None.  IMPRESSION: 1. Multiple gallstones within the gallbladder with pain over the gallbladder with compression, findings suggestive of early acute cholecystitis. 2. No abnormality of the liver is seen. 3. No hydronephrosis.   Electronically Signed   By: Ivar Drape M.D.   On: 04/03/2015 14:55      Assessment/Plan  Patient's history and physical exam as well as mild leukocytosis and ultrasound findings are all suggestive of acute cholecystitis or LFTs are normal. I discussed with her husband the rationale for offering admission to the hospital hydration pain and nausea control followed by laparoscopic cholecystectomy for ultimate control of her symptoms. She was in agreement with this plan. I discussed with her the rationale for surgery the options of observation the risks of bleeding infection recurrence of symptoms failure to resolve her symptoms conversion to an open procedure bile duct damage bile duct leak retained  common bile duct stone any of which could require further surgery and/or ERCP stent and papillotomy I also confirmed with her that Dr. Marina Gravel would be performing the surgery either tomorrow or the next day she understood and agreed to proceed  Florene Glen, MD, FACS

## 2015-04-03 NOTE — Patient Instructions (Signed)
Please see our referral coordinator for more information regarding your ultrasound.   Visit the lab before leaving today, please.

## 2015-04-03 NOTE — ED Notes (Signed)
Pt reports she was seen here Saturday with gall bladder issues, was d/c'd home and followed up with [facility] today who ordered Ultra sound of Gallbladder. Pt was told that she needed to have Gallbladder removed. Pt here with continued epigastric pain and vomiting.

## 2015-04-03 NOTE — ED Provider Notes (Signed)
[facility] Emergency Department Provider Note  ____________________________________________  Time seen: Approximately 6 PM  I have reviewed the triage vital signs and the nursing notes.   HISTORY  Chief Complaint Emesis and Abdominal Pain    HPI Anaya Bovee is a 34 y.o. female with a history of seasonal allergieswho presents today with right upper quadrant tenderness and after having an ultrasound that showed acute early cholecystitis. The patient was just seen recently in the emergency department for a presumed reaction to methylprednisolone. At that time she had chest pain and rapid heart rate. Over the weekend she developed worsening pain whichmigrated down from her chest into her upper abdomen. She also developed nausea and vomiting at that time.    Past Medical History  Diagnosis Date  . Depression   . Allergy     Seasonal  . Chicken pox   . Frequent headaches   . Migraines     Patient Active Problem List   Diagnosis Date Noted  . Nausea with vomiting 04/03/2015  . RUQ abdominal tenderness 04/03/2015  . Encounter to establish care 03/30/2015  . Depression 03/30/2015  . Frequent headaches 03/30/2015  . Migraines 03/30/2015  . Seasonal allergies 03/30/2015  . Sinusitis, acute 03/30/2015    Past Surgical History  Procedure Laterality Date  . Cesarean section      2003, 2005, 2009    Current Outpatient Rx  Name  Route  Sig  Dispense  Refill  . methylPREDNISolone (MEDROL) 4 MG tablet      Take 6 tablets by mouth with breakfast or lunch and decrease by 1 tablet each day until gone. Patient not taking: Reported on 04/03/2015   21 tablet   0   . ondansetron (ZOFRAN) 4 MG tablet   Oral   Take 1 tablet (4 mg total) by mouth every 8 (eight) hours as needed for nausea or vomiting.   20 tablet   0     Allergies Review of patient's allergies indicates no known allergies.  Family History  Problem Relation Age of Onset  . Multiple  sclerosis Mother   . Hypertension Father   . Cancer Maternal Grandmother     Breast Cancer  . Cancer Paternal Grandfather     Lung Cancer  . Heart disease Daughter     Social History History  Substance Use Topics  . Smoking status: Former Smoker    Types: Cigarettes    Quit date: 07/02/2013  . Smokeless tobacco: Never Used  . Alcohol Use: No    Review of Systems Constitutional: No fever/chills Eyes: No visual changes. ENT: No sore throat. Cardiovascular: Denies chest pain. Respiratory: Denies shortness of breath. Gastrointestinal:upper abdominal pain.  As aboveNo diarrhea.  No constipation. Genitourinary: Negative for dysuria. Musculoskeletal: Negative for back pain. Skin: Negative for rash. Neurological: Negative for headaches, focal weakness or numbness.  10-point ROS otherwise negative.  ____________________________________________   PHYSICAL EXAM:  VITAL SIGNS: ED Triage Vitals  Enc Vitals Group     BP 04/03/15 1540 161/101 mmHg     Pulse Rate 04/03/15 1540 113     Resp 04/03/15 1540 20     Temp 04/03/15 1540 97.8 F (36.6 C)     Temp Source 04/03/15 1540 Oral     SpO2 04/03/15 1540 100 %     Weight 04/03/15 1540 225 lb (102.059 kg)     Height 04/03/15 1540 5\' 3"  (1.6 m)     Head Cir --      Peak  Flow --      Pain Score 04/03/15 1541 1     Pain Loc --      Pain Edu? --      Excl. in GC? --     Constitutional: Alert and oriented. Well appearing and in no acute distress. Eyes: Conjunctivae are normal. PERRL. EOMI. Head: Atraumatic. Nose: No congestion/rhinnorhea. Mouth/Throat: Mucous membranes are moist.  Oropharynx non-erythematous. Neck: No stridor.   Cardiovascular: Normal rate, regular rhythm. Grossly normal heart sounds.  Good peripheral circulation. Respiratory: Normal respiratory effort.  No retractions. Lungs CTAB. Gastrointestinal: Soft with tenderness to the epigastrium and right upper quadrant with a positive Murphy sign.  No distention.  No abdominal bruits. No CVA tenderness. Musculoskeletal: No lower extremity tenderness nor edema.  No joint effusions. Neurologic:  Normal speech and language. No gross focal neurologic deficits are appreciated. No gait instability. Skin:  Skin is warm, dry and intact. No rash noted. Psychiatric: Mood and affect are normal. Speech and behavior are normal.  ____________________________________________   LABS (all labs ordered are listed, but only abnormal results are displayed)  Labs Reviewed  LIPASE, BLOOD - Abnormal; Notable for the following:    Lipase 15 (*)    All other components within normal limits  COMPREHENSIVE METABOLIC PANEL - Abnormal; Notable for the following:    CO2 20 (*)    Glucose, Bld 105 (*)    Total Protein 8.3 (*)    All other components within normal limits  CBC - Abnormal; Notable for the following:    WBC 12.4 (*)    All other components within normal limits   ____________________________________________  EKG   ____________________________________________  RADIOLOGY  Early cholecystitis on the right upper quadrant ultrasound. ____________________________________________   PROCEDURES    ____________________________________________   INITIAL IMPRESSION / ASSESSMENT AND PLAN / ED COURSE  Pertinent labs & imaging results that were available during my care of the patient were reviewed by me and considered in my medical decision making (see chart for details).  We'll give antibiotics and admitted the patient to the hospital for cholecystectomy. Signed out to Dr. Colette Ribas of the surgery service. ____________________________________________   FINAL CLINICAL IMPRESSION(S) / ED DIAGNOSES  Acute cholecystitis.    Myrna Blazer, MD 04/03/15 [phone]

## 2015-04-03 NOTE — Assessment & Plan Note (Signed)
Worsening. Will obtain repeat CBC w/ diff, HFP, lipase and STAT abdominal US. Will follow after results.

## 2015-04-03 NOTE — ED Notes (Signed)
surgeons in to see putting in admission orders

## 2015-04-03 NOTE — Assessment & Plan Note (Signed)
Zofran 4 mg tabs sent to pharmacy for nausea. Worsening abdominal pain.

## 2015-04-04 ENCOUNTER — Inpatient Hospital Stay: Payer: BLUE CROSS/BLUE SHIELD | Admitting: *Deleted

## 2015-04-04 ENCOUNTER — Encounter
Admission: EM | Disposition: A | Discharge status: Home / Self Care | Payer: Self-pay | Source: Home / Self Care | Attending: Surgery

## 2015-04-04 ENCOUNTER — Inpatient Hospital Stay: Payer: BLUE CROSS/BLUE SHIELD

## 2015-04-04 ENCOUNTER — Encounter: Payer: Self-pay | Admitting: Surgery

## 2015-04-04 DIAGNOSIS — K810 Acute cholecystitis: Secondary | ICD-10-CM

## 2015-04-04 HISTORY — PX: CHOLECYSTECTOMY: SHX55

## 2015-04-04 LAB — COMPREHENSIVE METABOLIC PANEL
ALBUMIN: 3.8 g/dL (ref 3.5–5.0)
ALT: 34 U/L (ref 14–54)
ANION GAP: 9 (ref 5–15)
AST: 23 U/L (ref 15–41)
Alkaline Phosphatase: 72 U/L (ref 38–126)
BILIRUBIN TOTAL: 0.9 mg/dL (ref 0.3–1.2)
BUN: 6 mg/dL (ref 6–20)
CO2: 22 mmol/L (ref 22–32)
CREATININE: 0.6 mg/dL (ref 0.44–1.00)
Calcium: 8.7 mg/dL — ABNORMAL LOW (ref 8.9–10.3)
Chloride: 109 mmol/L (ref 101–111)
GFR calc Af Amer: >60 mL/min (ref >60)
Glucose, Bld: 111 mg/dL — ABNORMAL HIGH (ref 65–99)
Potassium: 3.5 mmol/L (ref 3.5–5.1)
SODIUM: 140 mmol/L (ref 135–145)
Total Protein: 7.1 g/dL (ref 6.5–8.1)

## 2015-04-04 LAB — CBC
HCT: 38.5 % (ref 35.0–47.0)
Hemoglobin: 13.2 g/dL (ref 12.0–16.0)
MCH: 30.7 pg (ref 26.0–34.0)
MCHC: 34.3 g/dL (ref 32.0–36.0)
MCV: 89.5 fL (ref 80.0–100.0)
Platelets: 284 10*3/uL (ref 150–440)
RBC: 4.31 MIL/uL (ref 3.80–5.20)
RDW: 12.8 % (ref 11.5–14.5)
WBC: 7.4 10*3/uL (ref 3.6–11.0)

## 2015-04-04 LAB — PREGNANCY, URINE: PREG TEST UR: NEGATIVE

## 2015-04-04 SURGERY — LAPAROSCOPIC CHOLECYSTECTOMY
Anesthesia: General | Wound class: Clean Contaminated

## 2015-04-04 MED ORDER — ACETAMINOPHEN 10 MG/ML IV SOLN
INTRAVENOUS | Status: DC | PRN
  Administered 2015-04-04: 1000 mg via INTRAVENOUS

## 2015-04-04 MED ORDER — GELATIN ABSORBABLE MT POWD
OROMUCOSAL | Status: DC | PRN
  Administered 2015-04-04: 5 mL via TOPICAL

## 2015-04-04 MED ORDER — SUCCINYLCHOLINE CHLORIDE 20 MG/ML IJ SOLN
INTRAMUSCULAR | Status: DC | PRN
  Administered 2015-04-04: 120 mg via INTRAVENOUS

## 2015-04-04 MED ORDER — HYDROCODONE-ACETAMINOPHEN 5-325 MG PO TABS
1.0000 | ORAL_TABLET | ORAL | Status: DC | PRN
  Administered 2015-04-04 – 2015-04-05 (×3): 2 via ORAL
  Filled 2015-04-04 (×2): qty 2

## 2015-04-04 MED ORDER — PROPOFOL 10 MG/ML IV BOLUS
INTRAVENOUS | Status: DC | PRN
  Administered 2015-04-04: 200 mg via INTRAVENOUS

## 2015-04-04 MED ORDER — HYDROCODONE-ACETAMINOPHEN 5-325 MG PO TABS: 1.0000 | ORAL_TABLET | Freq: Four times a day (QID) | ORAL | Status: DC | PRN

## 2015-04-04 MED ORDER — SUGAMMADEX SODIUM 200 MG/2ML IV SOLN
INTRAVENOUS | Status: DC | PRN
  Administered 2015-04-04: 200 mg via INTRAVENOUS

## 2015-04-04 MED ORDER — METOCLOPRAMIDE HCL 5 MG/ML IJ SOLN: 10.0000 mg | Freq: Once | INTRAMUSCULAR | Status: AC | PRN

## 2015-04-04 MED ORDER — ROCURONIUM BROMIDE 100 MG/10ML IV SOLN
INTRAVENOUS | Status: DC | PRN
  Administered 2015-04-04: 40 mg via INTRAVENOUS
  Administered 2015-04-04: 10 mg via INTRAVENOUS

## 2015-04-04 MED ORDER — HYDROCODONE-ACETAMINOPHEN 5-325 MG PO TABS
1.0000 | ORAL_TABLET | Freq: Four times a day (QID) | ORAL | Status: DC | PRN
  Administered 2015-04-04: 2 via ORAL
  Filled 2015-04-04 (×2): qty 2

## 2015-04-04 MED ORDER — BUPIVACAINE HCL 0.25 % IJ SOLN
INTRAMUSCULAR | Status: DC | PRN
  Administered 2015-04-04: 30 mL

## 2015-04-04 MED ORDER — FENTANYL CITRATE (PF) 100 MCG/2ML IJ SOLN
INTRAMUSCULAR | Status: DC | PRN
  Administered 2015-04-04: 50 ug via INTRAVENOUS
  Administered 2015-04-04 (×2): 100 ug via INTRAVENOUS

## 2015-04-04 MED ORDER — HYDROMORPHONE HCL 1 MG/ML IJ SOLN
0.2500 mg | INTRAMUSCULAR | Status: DC | PRN
  Administered 2015-04-04 (×4): 0.5 mg via INTRAVENOUS

## 2015-04-04 MED ORDER — LIDOCAINE HCL (CARDIAC) 20 MG/ML IV SOLN
INTRAVENOUS | Status: DC | PRN
  Administered 2015-04-04: 100 mg via INTRAVENOUS

## 2015-04-04 MED ORDER — DEXAMETHASONE SODIUM PHOSPHATE 4 MG/ML IJ SOLN
INTRAMUSCULAR | Status: DC | PRN
  Administered 2015-04-04: 10 mg via INTRAVENOUS

## 2015-04-04 MED ORDER — KETOROLAC TROMETHAMINE 30 MG/ML IJ SOLN
INTRAMUSCULAR | Status: DC | PRN
  Administered 2015-04-04: 30 mg via INTRAVENOUS

## 2015-04-04 MED ORDER — MIDAZOLAM HCL 2 MG/2ML IJ SOLN
INTRAMUSCULAR | Status: DC | PRN
  Administered 2015-04-04: 2 mg via INTRAVENOUS

## 2015-04-04 SURGICAL SUPPLY — 49 items
APPLICATOR SURGIFLO (MISCELLANEOUS) ×2 IMPLANT
APPLIER CLIP 5 13 M/L LIGAMAX5 (MISCELLANEOUS) ×2
BAG COUNTER SPONGE EZ (MISCELLANEOUS) IMPLANT
BULB RESERV EVAC DRAIN JP 100C (MISCELLANEOUS) ×2 IMPLANT
CANISTER SUCT 1200ML W/VALVE (MISCELLANEOUS) ×2 IMPLANT
CATH CHOLANG 76X19 KUMAR (CATHETERS) ×2 IMPLANT
CHLORAPREP W/TINT 26ML (MISCELLANEOUS) ×2 IMPLANT
CLIP APPLIE 5 13 M/L LIGAMAX5 (MISCELLANEOUS) ×1 IMPLANT
DEFOGGER SCOPE WARMER CLEARIFY (MISCELLANEOUS) ×2 IMPLANT
DISSECTOR KITTNER STICK (MISCELLANEOUS) ×1 IMPLANT
DISSECTORS/KITTNER STICK (MISCELLANEOUS) ×2
DRAIN CHANNEL JP 19F (MISCELLANEOUS) IMPLANT
DRAPE SHEET LG 3/4 BI-LAMINATE (DRAPES) ×2 IMPLANT
DRAPE UTILITY 15X26 TOWEL STRL (DRAPES) ×4 IMPLANT
DRSG TEGADERM 2-3/8X2-3/4 SM (GAUZE/BANDAGES/DRESSINGS) ×8 IMPLANT
DRSG TELFA 3X8 NADH (GAUZE/BANDAGES/DRESSINGS) ×2 IMPLANT
ENDOPOUCH RETRIEVER 10 (MISCELLANEOUS) ×2 IMPLANT
GLOVE BIO SURGEON STRL SZ7.5 (GLOVE) ×12 IMPLANT
GOWN STRL REUS W/ TWL LRG LVL3 (GOWN DISPOSABLE) IMPLANT
GOWN STRL REUS W/ TWL XL LVL3 (GOWN DISPOSABLE) ×3 IMPLANT
GOWN STRL REUS W/TWL LRG LVL3 (GOWN DISPOSABLE)
GOWN STRL REUS W/TWL XL LVL3 (GOWN DISPOSABLE) ×3
IRRIGATION STRYKERFLOW (MISCELLANEOUS) ×1 IMPLANT
IRRIGATOR STRYKERFLOW (MISCELLANEOUS) ×2
IV NS 1000ML (IV SOLUTION) ×1
IV NS 1000ML BAXH (IV SOLUTION) ×1 IMPLANT
KIT PINK PAD W/HEAD ARE REST (MISCELLANEOUS)
KIT PINK PAD W/HEAD ARM REST (MISCELLANEOUS) IMPLANT
LABEL OR SOLS (LABEL) ×2 IMPLANT
NEEDLE 18GX1X1/2 (RX/OR ONLY) (NEEDLE) ×2 IMPLANT
NEEDLE HYPO 25X1 1.5 SAFETY (NEEDLE) ×2 IMPLANT
NS IRRIG 500ML POUR BTL (IV SOLUTION) ×2 IMPLANT
PACK LAP CHOLECYSTECTOMY (MISCELLANEOUS) ×2 IMPLANT
PAD GROUND ADULT SPLIT (MISCELLANEOUS) ×2 IMPLANT
SCISSORS METZENBAUM CVD 33 (INSTRUMENTS) ×2 IMPLANT
SLEEVE ADV FIXATION 5X100MM (TROCAR) ×6 IMPLANT
SPOGE SURGIFLO 8M (HEMOSTASIS) ×1
SPONGE SURGIFLO 8M (HEMOSTASIS) ×1 IMPLANT
STRAP SAFETY BODY (MISCELLANEOUS) ×2 IMPLANT
STRIP CLOSURE SKIN 1/2X4 (GAUZE/BANDAGES/DRESSINGS) ×2 IMPLANT
SUT ETHILON 3-0 FS-10 30 BLK (SUTURE)
SUT VIC AB 0 UR5 27 (SUTURE) ×4 IMPLANT
SUT VIC AB 4-0 FS2 27 (SUTURE) ×2 IMPLANT
SUTURE EHLN 3-0 FS-10 30 BLK (SUTURE) IMPLANT
SWABSTK COMLB BENZOIN TINCTURE (MISCELLANEOUS) ×2 IMPLANT
SYR 3ML LL SCALE MARK (SYRINGE) ×2 IMPLANT
TROCAR XCEL BLUNT TIP 100MML (ENDOMECHANICALS) ×2 IMPLANT
TROCAR Z-THREAD OPTICAL 5X100M (TROCAR) ×2 IMPLANT
TUBING INSUFFLATOR HI FLOW (MISCELLANEOUS) ×2 IMPLANT

## 2015-04-04 NOTE — Progress Notes (Signed)
Patient ID: Tiffany Jackson, female   DOB: 1981/07/12, 34 y.o.   MRN: [id number]   Care from Dr. Excell Seltzer. History and examination reviewed. Ultrasound also reviewed. Plan laparoscopic cholecystectomy later this afternoon.  We have discussed with her the risks of surgery including that of bleeding infection need for conversion open operation bile duct injury or future ERCP and all her questions were answered.

## 2015-04-04 NOTE — Anesthesia Postprocedure Evaluation (Signed)
  Anesthesia Post-op Note  Patient: Tiffany Jackson  Procedure(s) Performed: Procedure(s): LAPAROSCOPIC CHOLECYSTECTOMY (N/A)  Anesthesia type:General  Patient location: PACU  Post pain: Pain level controlled  Post assessment: Post-op Vital signs reviewed, Patient's Cardiovascular Status Stable, Respiratory Function Stable, Patent Airway and No signs of Nausea or vomiting  Post vital signs: Reviewed and stable  Last Vitals:  Filed Vitals:   04/04/15 0733  BP: 129/81  Pulse: 93  Temp: 36.9 C  Resp: 17    Level of consciousness: awake, alert  and patient cooperative  Complications: No apparent anesthesia complications

## 2015-04-04 NOTE — Anesthesia Procedure Notes (Signed)
Procedure Name: Intubation Date/Time: 04/04/2015 1:10 PM Performed by: Junious Silk Pre-anesthesia Checklist: Patient identified, Patient being monitored, Timeout performed, Emergency Drugs available and Suction available Patient Re-evaluated:Patient Re-evaluated prior to inductionOxygen Delivery Method: Circle system utilized Preoxygenation: Pre-oxygenation with 100% oxygen Intubation Type: IV induction Ventilation: Mask ventilation without difficulty Laryngoscope Size: Mac and 3 Grade View: Grade I Tube type: Oral Tube size: 7.0 mm Number of attempts: 1 Airway Equipment and Method: Stylet Placement Confirmation: ETT inserted through vocal cords under direct vision,  positive ETCO2 and breath sounds checked- equal and bilateral Secured at: 21 cm Tube secured with: Tape Dental Injury: Teeth and Oropharynx as per pre-operative assessment

## 2015-04-04 NOTE — Op Note (Signed)
04/03/2015 - 04/04/2015  2:28 PM  PATIENT:  Tiffany Jackson  34 y.o. female  PRE-OPERATIVE DIAGNOSIS:  Acute cholecystitis  POST-OPERATIVE DIAGNOSIS:  Acute and chronic cholecystitis with hydrops   PROCEDURE:  Procedure(s): LAPAROSCOPIC CHOLECYSTECTOMY (N/A)  SURGEON:  Surgeon(s) and Role:    Natale Lay, MD - Primary  ASSISTANTS: None  ANESTHESIA: Gen. endotracheal     SPECIMEN: Gallbladder and stones    EBL: 10 cc  Description of procedure:    Informed consent supine position sterile prep and drape timeout was observed.   12 mm blunt Hassan trocar was placed in an open technique through an infra umbilical transverse skin incision and stay sutures being passed to the fascia. Pneumoperitoneum was established.   Millimeter trocar was placed in the epigastric region and 25 mm trochars in the right upper quadrant. The gallbladder was aspirated by hydropic fluid. He was grasped on its fundus and elevated towards right shoulder. Omental adhesions as well as dissection of the stomach off the mid body of the gallbladder was achieved with sharp dissection and point cautery. Lateral traction was achieved on Hartman's pouch. Dissection of the hepatoduodenal ligament demonstrated a cystic duct and single cystic artery and a critical view of safety cholecystectomy was thus achieved. The cystic duct was triply total size of the gallbladder side and divided. Cystic artery was doubly clipped on the portal side singly clipped on the gallbladder side and divided. The gallbladder was then taken down off the gallbladder Jackson utilizing hook cautery apparatus. Point hemostasis chain with point cautery the application of Surgi-Flo with thrombin. Specimen was captured in an Endo Catch device and retrieved.  The infraumbilical fascial defect was then closed utilizing an additional #0 Vicryls suture in vertical orientation. A total of 30 cc of 0.25% plain Marcaine was infiltrated on the operative field prior to  closure. 4-0 Vicryls septic was applied to skin edges. Steri-Strips and Telfa and Tegaderm were then applied and the patient was subsequently extubated and taken to the recovery room in stable and satisfactory condition by anesthesia.

## 2015-04-04 NOTE — Progress Notes (Signed)
Patient ID: Tiffany Jackson, female   DOB: September 08, 1980, 34 y.o.   MRN: [id number]   The patient is seen postop.   She is hemodynamically stable. Her abdomen is soft. Pain seems inadequately controlled. We will work on maybe getting her discharge tonight or first thing in the morning. Her and her husband are in agreement.   Discussed with nurse.

## 2015-04-04 NOTE — Anesthesia Preprocedure Evaluation (Addendum)
Anesthesia Evaluation  Patient identified by MRN, date of birth, ID band Patient awake    Reviewed: Allergy & Precautions, NPO status , Patient's Chart, lab work & pertinent test results  Airway Mallampati: I  TM Distance: >3 FB Neck ROM: Full    Dental  (+) Teeth Intact   Pulmonary former smoker,    Pulmonary exam normal       Cardiovascular Exercise Tolerance: Good negative cardio ROS Normal cardiovascular exam    Neuro/Psych  Headaches,    GI/Hepatic   Endo/Other    Renal/GU      Musculoskeletal   Abdominal   Peds  Hematology   Anesthesia Other Findings   Reproductive/Obstetrics                           Anesthesia Physical Anesthesia Plan  ASA: II and emergent  Anesthesia Plan: General   Post-op Pain Management:    Induction: Intravenous and Rapid sequence  Airway Management Planned: Oral ETT  Additional Equipment:   Intra-op Plan:   Post-operative Plan: Extubation in OR  Informed Consent: I have reviewed the patients History and Physical, chart, labs and discussed the procedure including the risks, benefits and alternatives for the proposed anesthesia with the patient or authorized representative who has indicated his/her understanding and acceptance.     Plan Discussed with: CRNA  Anesthesia Plan Comments:         Anesthesia Quick Evaluation

## 2015-04-04 NOTE — Transfer of Care (Signed)
Immediate Anesthesia Transfer of Care Note  Patient: Tiffany Jackson  Procedure(s) Performed: Procedure(s): LAPAROSCOPIC CHOLECYSTECTOMY (N/A)  Patient Location: PACU  Anesthesia Type:General  Level of Consciousness: awake and sedated  Airway & Oxygen Therapy: Patient connected to face mask oxygen  Post-op Assessment: Report given to RN and Post -op Vital signs reviewed and stable  Post vital signs: Reviewed and stable  Last Vitals:  Filed Vitals:   04/04/15 0733  BP: 129/81  Pulse: 93  Temp: 36.9 C  Resp: 17    Complications: No apparent anesthesia complications

## 2015-04-05 ENCOUNTER — Inpatient Hospital Stay: Payer: BLUE CROSS/BLUE SHIELD

## 2015-04-05 MED ORDER — KETOROLAC TROMETHAMINE 30 MG/ML IJ SOLN
30.0000 mg | Freq: Three times a day (TID) | INTRAMUSCULAR | Status: DC
  Administered 2015-04-05: 30 mg via INTRAVENOUS
  Filled 2015-04-05: qty 1

## 2015-04-05 MED ORDER — IOHEXOL 350 MG/ML SOLN
100.0000 mL | Freq: Once | INTRAVENOUS | Status: AC | PRN
  Administered 2015-04-05: 100 mL via INTRAVENOUS

## 2015-04-05 MED ORDER — HYDROCODONE-ACETAMINOPHEN 5-325 MG PO TABS: 1.0000 | ORAL_TABLET | ORAL | Status: DC | PRN

## 2015-04-05 NOTE — Progress Notes (Signed)
Patient ID: Tiffany Jackson, female   DOB: October 10, 1980, 34 y.o.   MRN: [id number]   She is complaining of left upper chest pain. She's had bites of food. She did have some marked abdominal pain which has resolved since last night. The pain that she describes in her chest is not pleuritic in nature. No hoarseness sound muscle cells in origin.  Filed Vitals:   04/04/15 1605 04/04/15 1734 04/05/15 0012 04/05/15 0724  BP: 133/79 132/79 109/68 120/72  Pulse: 97 100 94 90  Temp: 97.8 F (36.6 C) 98.1 F (36.7 C) 98.1 F (36.7 C) 97.9 F (36.6 C)  TempSrc: Oral Oral Oral Oral  Resp: Height:      Weight:      SpO2: 97% 100% 97% 100%   Pe  The patient is in no distress. Her abdomen is soft and nontender. Lungs are clear bilaterally.  Imp: post op discomfort likely from pneumoperitoneum  Plan:  Toradol. Mobilize may be ready for discharge later this afternoon.

## 2015-04-05 NOTE — Discharge Summary (Signed)
Physician Discharge Summary  Patient ID: Tiffany Jackson MRN: [id number] DOB/AGE: 03-Mar-1981 59 y.o.  Admit date: 04/03/2015 Discharge date: 04/05/2015  Admission Diagnoses: Acute cholecystitis  Discharge Diagnoses:  Active Problems:   Acute cholecystitis   Discharged Condition: stable and improved  Hospital Course: Admitted with several says of epigastric and RUQ abdominal pain.  Laparoscopic cholecystectomy done same day as admission.  POD 1 doing well except for Left upper chest pain.  Ct scan to rule out PE was negative.  Tolerated diet and pain was well controlled.  Discharge home on POD 1.  Consults: none.  Treatments: cholecystectomy.  Disposition: 01-Home or Self Care     Medication List    STOP taking these medications        acetaminophen 325 MG tablet  Commonly known as:  TYLENOL     bismuth subsalicylate 262 MG/15ML suspension  Commonly known as:  PEPTO BISMOL     methylPREDNISolone 4 MG tablet  Commonly known as:  MEDROL     ondansetron 4 MG tablet  Commonly known as:  ZOFRAN      TAKE these medications        HYDROcodone-acetaminophen 5-325 MG per tablet  Commonly known as:  NORCO/VICODIN  Take 1-2 tablets by mouth every 6 (six) hours as needed for moderate pain.     ranitidine 150 MG tablet  Commonly known as:  ZANTAC  Take 150 mg by mouth daily as needed for heartburn.           Follow-up Information    Follow up with Natale Lay, MD On 04/12/2015.   Specialties:  Surgery, Radiology   Why:  For suture removal, For wound re-check   Contact information:   [address] [place] [address] [phone]       Signed: Natale Lay 04/05/2015, 5:12 PM

## 2015-04-05 NOTE — Progress Notes (Signed)
Pt called out stating she had chest pressure. Vital signs were BP 136/86, HR 98, O2 sats 98 on rm air. RR 16. Pt does state it feels like "gas pains". MD called and notified, he suggested no further actions.

## 2015-04-05 NOTE — Discharge Instructions (Signed)
Cholecystitis ° Cholecystitis is swelling and irritation (inflammation) of your gallbladder. This often happens when gallstones or sludge build up in the gallbladder. Treatment is needed right away. °HOME CARE °Home care depends on how you were treated. In general: °· If you were given antibiotic medicine, take it as told. Finish the medicine even if you start to feel better. °· Only take medicines as told by your doctor. °· Eat low-fat foods until your next doctor visit. °· Keep all doctor visits as told. °GET HELP RIGHT AWAY IF: °· You have more pain and medicine does not help. °· Your pain moves to a different part of your belly (abdomen) or to your back. °· You have a fever. °· You feel sick to your stomach (nauseous). °· You throw up (vomit). °MAKE SURE YOU: °· Understand these instructions. °· Will watch your condition. °· Will get help right away if you are not doing well or get worse. °Document Released: 08/07/2011 Document Revised: 11/10/2011 Document Reviewed: 08/07/2011 °[employer]® Patient Information ©2015 [employer]. This information is not intended to replace advice given to you by your health care provider. Make sure you discuss any questions you have with your health care provider. ° °

## 2015-04-05 NOTE — Progress Notes (Signed)
Pt to be discharged per Md order. Iv removed. Instructions given to pt and spouse. All questions answered. Taken out in wheelchair via nursing

## 2015-04-06 LAB — SURGICAL PATHOLOGY

## 2015-04-12 ENCOUNTER — Ambulatory Visit (INDEPENDENT_AMBULATORY_CARE_PROVIDER_SITE_OTHER): Payer: BLUE CROSS/BLUE SHIELD | Admitting: Surgery

## 2015-04-12 ENCOUNTER — Encounter: Payer: Self-pay | Admitting: Surgery

## 2015-04-12 VITALS — BP 147/107 | HR 134 | Temp 98.4°F | Ht 64.0 in | Wt 221.4 lb

## 2015-04-12 DIAGNOSIS — R10811 Right upper quadrant abdominal tenderness: Secondary | ICD-10-CM

## 2015-04-12 DIAGNOSIS — K810 Acute cholecystitis: Secondary | ICD-10-CM

## 2015-04-12 NOTE — Progress Notes (Signed)
Surgery    S/P lap chole last week  She is without c/o.  No pain, no n/v/f/d/jaundice.  States that HR is always up when at MD's office and that she is going to see new PCP tomorrow regarding intermittent elevated HR.  Filed Vitals:   04/12/15 1353 04/12/15 1354  BP: 154/110 147/107  Pulse: 138 134  Temp: 98.4 F (36.9 C)   TempSrc: Oral   Height:  (1.626 m)   Weight: 221 lb 6.4 oz (100.426 kg)     PE: She is alert and oriented. Her abdomen is soft and nontender incisions healed nicely.   IMP she is doing well from her surgery. She is about a week out.  Plan a copy of the pathology report was reviewed with the patient. She will follow-up with her PCP regarding her intermittent tachycardia of unclear etiology. She did admit that she was very anxious come to the doctor today. She was obviously no distress. Recent CT scan of the chest pulmonary angiogram demonstrated no signs for pulmonary embolism.

## 2015-04-12 NOTE — Patient Instructions (Signed)
Remember to follow-up with you PCP tomorrow regarding your fast Heart Rate and elevated Blood Pressure.  Please call our office with any questions or concerns.

## 2015-04-13 ENCOUNTER — Encounter: Payer: Self-pay | Admitting: Nurse Practitioner

## 2015-04-13 ENCOUNTER — Ambulatory Visit (INDEPENDENT_AMBULATORY_CARE_PROVIDER_SITE_OTHER): Payer: BLUE CROSS/BLUE SHIELD | Admitting: Nurse Practitioner

## 2015-04-13 VITALS — BP 138/94 | HR 95 | Temp 98.1°F | Resp 14 | Ht 63.0 in | Wt 220.8 lb

## 2015-04-13 DIAGNOSIS — R112 Nausea with vomiting, unspecified: Secondary | ICD-10-CM

## 2015-04-13 DIAGNOSIS — K810 Acute cholecystitis: Secondary | ICD-10-CM

## 2015-04-13 DIAGNOSIS — I10 Essential (primary) hypertension: Secondary | ICD-10-CM | POA: Diagnosis not present

## 2015-04-13 MED ORDER — DICYCLOMINE HCL 10 MG PO CAPS: ORAL_CAPSULE | ORAL | Status: DC

## 2015-04-13 MED ORDER — OMEPRAZOLE 40 MG PO CPDR: 40.0000 mg | DELAYED_RELEASE_CAPSULE | Freq: Every day | ORAL | Status: DC

## 2015-04-13 MED ORDER — AMLODIPINE BESYLATE 5 MG PO TABS: 5.0000 mg | ORAL_TABLET | Freq: Every day | ORAL | Status: DC

## 2015-04-13 NOTE — Assessment & Plan Note (Addendum)
Stable, gave pt prilosec to try for GERD, pt has nausea medications at home (zofran), and Bentyl for frequent bowel movements. Follow up next week

## 2015-04-13 NOTE — Progress Notes (Signed)
Pre visit review using our clinic review tool, if applicable. No additional management support is needed unless otherwise documented below in the visit note. 

## 2015-04-13 NOTE — Assessment & Plan Note (Signed)
Started pt on Norvasc 5 mg at night. Will follow up in 1 week.   BP Readings from Last 3 Encounters:  04/13/15 138/94  04/12/15 147/107  04/05/15 133/78

## 2015-04-13 NOTE — Assessment & Plan Note (Signed)
Gallbladder was removed by Dr. Egbert Garibaldi. Pt reports still having diarrhea, vomiting, and nausea. Her laparascopic sites look good and healing well.

## 2015-04-13 NOTE — Progress Notes (Signed)
Patient ID: Tiffany Jackson, female    DOB: [birth year]  Age: 34 y.o. MRN: [id number]  CC: Hypertension   HPI Tiffany Jackson presents for follow up on HTN and Indigestion since surgery.   1) GERD- Nausea, diarrhea constant soft stools, Zantac is not touching it. Vomited for 5 minutes this morning only she reports. Has not taken her nausea medication from home, she reports after the surgery the diarrhea was still happening.   2) HTN- Below are recordings from post-op visit. 147/107 R 154/107 L They asked pt to follow up today for BP medications. No past treatments   History Tiffany Jackson has a past medical history of Depression; Allergy; Chicken pox; Frequent headaches; and Migraines.   She has past surgical history that includes Cesarean section and Cholecystectomy (N/A, 04/04/2015).   Her family history includes Cancer in her maternal grandmother and paternal grandfather; Heart disease in her daughter; Hypertension in her father; Multiple sclerosis in her mother.She reports that she quit smoking about 21 months ago. Her smoking use included Cigarettes. She has never used smokeless tobacco. She reports that she does not drink alcohol or use illicit drugs.  Outpatient Prescriptions Prior to Visit  Medication Sig Dispense Refill  . ranitidine (ZANTAC) 150 MG tablet Take 150 mg by mouth daily as needed for heartburn.    . cetirizine (ZYRTEC) 10 MG tablet Take 10 mg by mouth daily as needed for allergies.     No facility-administered medications prior to visit.   ROS Review of Systems  Constitutional: Negative for fever, chills, diaphoresis and fatigue.  Respiratory: Negative for chest tightness, shortness of breath and wheezing.   Cardiovascular: Negative for chest pain, palpitations and leg swelling.  Gastrointestinal: Positive for nausea, vomiting and diarrhea. Negative for abdominal pain, constipation, blood in stool, abdominal distention and anal bleeding.  Skin: Negative for rash.   Neurological: Negative for dizziness, weakness, numbness and headaches.  Psychiatric/Behavioral: The patient is not nervous/anxious.     Objective:  BP 138/94 mmHg  Pulse 95  Temp(Src) 98.1 F (36.7 C)  Resp 14  Ht 5\' 3"  (1.6 m)  Wt 220 lb 12.8 oz (100.154 kg)  BMI 39.12 kg/m2  SpO2 98%  LMP 04/03/2015 (Exact Date)  Physical Exam  Constitutional: She is oriented to person, place, and time. She appears well-developed and well-nourished. No distress.  HENT:  Head: Normocephalic and atraumatic.  Right Ear: External ear normal.  Left Ear: External ear normal.  Cardiovascular: Normal rate, regular rhythm and normal heart sounds.  Exam reveals no gallop and no friction rub.   No murmur heard. Pulmonary/Chest: Effort normal and breath sounds normal. No respiratory distress. She has no wheezes. She has no rales. She exhibits no tenderness.  Neurological: She is alert and oriented to person, place, and time. No cranial nerve deficit. She exhibits normal muscle tone. Coordination normal.  Skin: Skin is warm and dry. No rash noted. She is not diaphoretic.  Well healing surgical sites, 4 visualized  Psychiatric: She has a normal mood and affect. Her behavior is normal. Judgment and thought content normal.      Assessment & Plan:   Tiffany Jackson was seen today for hypertension.  Diagnoses and all orders for this visit:  Acute cholecystitis  Non-intractable vomiting with nausea, vomiting of unspecified type  Benign essential HTN  Other orders -     amLODipine (NORVASC) 5 MG tablet; Take 1 tablet (5 mg total) by mouth daily. -     omeprazole (PRILOSEC) 40 MG capsule; Take 1  capsule (40 mg total) by mouth daily. -     dicyclomine (BENTYL) 10 MG capsule; Take up to capsules three times a day.  I have discontinued Tiffany Jackson's ranitidine. I am also having her start on amLODipine, omeprazole, and dicyclomine. Additionally, I am having her maintain her cetirizine.  Meds ordered this  encounter  Medications  . amLODipine (NORVASC) 5 MG tablet    Sig: Take 1 tablet (5 mg total) by mouth daily.    Dispense:  30 tablet    Refill:  0    Order Specific Question:  Supervising Provider    Answer:  Duncan Dull L [[id number]]  . omeprazole (PRILOSEC) 40 MG capsule    Sig: Take 1 capsule (40 mg total) by mouth daily.    Dispense:  30 capsule    Refill:  0    Order Specific Question:  Supervising Provider    Answer:  Duncan Dull L [[id number]]  . dicyclomine (BENTYL) 10 MG capsule    Sig: Take up to capsules three times a day.    Dispense:  90 capsule    Refill:  0    Order Specific Question:  Supervising Provider    Answer:  Sherlene Shams [[id number]]     Follow-up: Return in about 1 week (around 04/20/2015).

## 2015-04-13 NOTE — Patient Instructions (Signed)
Prilosec for heart burn and chest pain- Take 30 min before larges meal daily  Bentyl- Slow BM down and help with cramping - can take up to 3 x daily  Amlodipine- at night for blood pressure.   Follow up in 1 week.

## 2015-04-20 ENCOUNTER — Ambulatory Visit (INDEPENDENT_AMBULATORY_CARE_PROVIDER_SITE_OTHER): Payer: BLUE CROSS/BLUE SHIELD | Admitting: Nurse Practitioner

## 2015-04-20 VITALS — BP 130/100 | HR 117 | Temp 97.8°F | Resp 14 | Ht 63.0 in | Wt 220.8 lb

## 2015-04-20 DIAGNOSIS — F418 Other specified anxiety disorders: Secondary | ICD-10-CM

## 2015-04-20 MED ORDER — VENLAFAXINE HCL ER 37.5 MG PO CP24: 37.5000 mg | ORAL_CAPSULE | Freq: Every day | ORAL | Status: DC

## 2015-04-20 MED ORDER — ALPRAZOLAM 0.25 MG PO TABS: 0.2500 mg | ORAL_TABLET | Freq: Every evening | ORAL | Status: DC | PRN

## 2015-04-20 NOTE — Progress Notes (Signed)
Patient ID: Tiffany Jackson, female    DOB: June 11, 1981  Age: 34 y.o. MRN: [id number]  CC: Follow-up   HPI Tiffany Jackson presents for follow up of chest pain and anxiety. She is accompanied by her 3 sons today.   1) Still having chest pain, but doing well. No changes to quality.  2) [place] and she feels this is unacceptable.    History Tiffany Jackson has a past medical history of Depression; Allergy; Chicken pox; Frequent headaches; Migraines; and Hypertension.   She has past surgical history that includes Cesarean section and Cholecystectomy (N/A, 04/04/2015).   Her family history includes Cancer in her maternal grandmother and paternal grandfather; Heart disease in her daughter; Hypertension in her father; Multiple sclerosis in her mother.She reports that she quit smoking about 21 months ago. Her smoking use included Cigarettes. She has never used smokeless tobacco. She reports that she does not drink alcohol or use illicit drugs.  Outpatient Prescriptions Prior to Visit  Medication Sig Dispense Refill  . amLODipine (NORVASC) 5 MG tablet Take 1 tablet (5 mg total) by mouth daily. 30 tablet 0  . dicyclomine (BENTYL) 10 MG capsule Take up to capsules three times a day. 90 capsule 0  . omeprazole (PRILOSEC) 40 MG capsule Take 1 capsule (40 mg total) by mouth daily. 30 capsule 0  . cetirizine (ZYRTEC) 10 MG tablet Take 10 mg by mouth daily as needed for allergies.     No facility-administered medications prior to visit.    ROS Review of Systems  Constitutional: Negative for fever, chills, diaphoresis and fatigue.  Respiratory: Negative for chest tightness, shortness of breath and wheezing.   Cardiovascular: Positive for chest pain. Negative for palpitations and leg swelling.       Left side, stable (had work up in ED)  Gastrointestinal: Negative for nausea, vomiting and diarrhea.  Skin: Negative for rash.  Neurological: Negative for dizziness, weakness,  numbness and headaches.  Psychiatric/Behavioral: Positive for sleep disturbance. Negative for suicidal ideas. The patient is nervous/anxious.     Objective:  BP 130/100 mmHg  Pulse 117  Temp(Src) 97.8 F (36.6 C)  Resp 14  Ht 5\' 3"  (1.6 m)  Wt 220 lb 12.8 oz (100.154 kg)  BMI 39.12 kg/m2  SpO2 98%  LMP 04/03/2015 (Exact Date)  Physical Exam  Constitutional: She is oriented to person, place, and time. She appears well-developed and well-nourished. No distress.  HENT:  Head: Normocephalic and atraumatic.  Right Ear: External ear normal.  Left Ear: External ear normal.  Cardiovascular: Regular rhythm.   Pulmonary/Chest: Effort normal and breath sounds normal. No respiratory distress. She has no wheezes. She has no rales. She exhibits no tenderness.  Neurological: She is alert and oriented to person, place, and time. No cranial nerve deficit. She exhibits normal muscle tone. Coordination normal.  Skin: Skin is warm and dry. No rash noted. She is not diaphoretic.  Psychiatric: Judgment and thought content normal.  Visibly anxious, sits on edge of chair, talks fast   Assessment & Plan:   Tiffany Jackson was seen today for follow-up.  Diagnoses and all orders for this visit:  Depression with anxiety  Other orders -     venlafaxine XR (EFFEXOR-XR) 37.5 MG 24 hr capsule; Take 1 capsule (37.5 mg total) by mouth daily with breakfast. -     ALPRAZolam (XANAX) 0.25 MG tablet; Take 1 tablet (0.25 mg total) by mouth at bedtime as needed for anxiety.  I am having Tiffany Jackson  start on venlafaxine XR and ALPRAZolam. I am also having her maintain her amLODipine, omeprazole, and dicyclomine.  Meds ordered this encounter  Medications  . venlafaxine XR (EFFEXOR-XR) 37.5 MG 24 hr capsule    Sig: Take 1 capsule (37.5 mg total) by mouth daily with breakfast.    Dispense:  30 capsule    Refill:  1    Order Specific Question:  Supervising Provider    Answer:  Duncan Dull L [[id number]]  . ALPRAZolam  (XANAX) 0.25 MG tablet    Sig: Take 1 tablet (0.25 mg total) by mouth at bedtime as needed for anxiety.    Dispense:  30 tablet    Refill:  0    Order Specific Question:  Supervising Provider    Answer:  Sherlene Shams [[id number]]     Follow-up: Return in about 4 weeks (around 05/18/2015) for Anxiety.

## 2015-04-20 NOTE — Progress Notes (Signed)
Pre visit review using our clinic review tool, if applicable. No additional management support is needed unless otherwise documented below in the visit note. 

## 2015-04-20 NOTE — Patient Instructions (Signed)
Follow up in 4 weeks.   Generalized Anxiety Disorder Generalized anxiety disorder (GAD) is a mental disorder. It interferes with life functions, including relationships, work, and school. GAD is different from normal anxiety, which everyone experiences at some point in their lives in response to specific life events and activities. Normal anxiety actually helps [place] prepare for and get through these life events and activities. Normal anxiety goes away after the event or activity is over.  GAD causes anxiety that is not necessarily related to specific events or activities. It also causes excess anxiety in proportion to specific events or activities. The anxiety associated with GAD is also difficult to control. GAD can vary from mild to severe. People with severe GAD can have intense waves of anxiety with physical symptoms (panic attacks).  SYMPTOMS The anxiety and worry associated with GAD are difficult to control. This anxiety and worry are related to many life events and activities and also occur more days than not for 6 months or longer. People with GAD also have three or more of the following symptoms (one or more in children):  Restlessness.   Fatigue.  Difficulty concentrating.   Irritability.  Muscle tension.  Difficulty sleeping or unsatisfying sleep. DIAGNOSIS GAD is diagnosed through an assessment by your health care provider. Your health care provider will ask you questions aboutyour mood,physical symptoms, and events in your life. Your health care provider may ask you about your medical history and use of alcohol or drugs, including prescription medicines. Your health care provider may also do a physical exam and blood tests. Certain medical conditions and the use of certain substances can cause symptoms similar to those associated with GAD. Your health care provider may refer you to a mental health specialist for further evaluation. TREATMENT The following therapies are usually  used to treat GAD:   Medication. Antidepressant medication usually is prescribed for long-term daily control. Antianxiety medicines may be added in severe cases, especially when panic attacks occur.   Talk therapy (psychotherapy). Certain types of talk therapy can be helpful in treating GAD by providing support, education, and guidance. A form of talk therapy called cognitive behavioral therapy can teach you healthy ways to think about and react to daily life events and activities.  Stress managementtechniques. These include yoga, meditation, and exercise and can be very helpful when they are practiced regularly. A mental health specialist can help determine which treatment is best for you. Some people see improvement with one therapy. However, other people require a combination of therapies. Document Released: 12/13/2012 Document Revised: 01/02/2014 Document Reviewed: 12/13/2012 [facility] Patient Information 2015 [place], [place]. This information is not intended to replace advice given to you by your health care provider. Make sure you discuss any questions you have with your health care provider.

## 2015-04-23 ENCOUNTER — Ambulatory Visit: Payer: BLUE CROSS/BLUE SHIELD | Admitting: Nurse Practitioner

## 2015-04-24 ENCOUNTER — Other Ambulatory Visit: Payer: Self-pay | Admitting: Nurse Practitioner

## 2015-04-24 ENCOUNTER — Telehealth: Payer: Self-pay | Admitting: *Deleted

## 2015-04-24 NOTE — Telephone Encounter (Signed)
Pt called states the Effexor is not effective during the day.  Further states the Xanax works well at night to cut everything off.  Please advise

## 2015-04-24 NOTE — Telephone Encounter (Signed)
Please let her know that it takes 4 weeks to get to full efficacy in her system. The xanax is only to help her while the effexor is building in her system. I am glad it is helpful. We will discuss at her next visit in 4 weeks.

## 2015-04-24 NOTE — Telephone Encounter (Signed)
Spoke with pt, advised of Carries message.  Pt verbalized understanding 

## 2015-04-24 NOTE — Telephone Encounter (Signed)
Please advise on refill of Zofran

## 2015-04-29 ENCOUNTER — Encounter: Payer: Self-pay | Admitting: Nurse Practitioner

## 2015-04-29 ENCOUNTER — Encounter: Payer: Self-pay | Admitting: Emergency Medicine

## 2015-04-29 ENCOUNTER — Emergency Department
Admission: EM | Admit: 2015-04-29 | Discharge: 2015-04-29 | Disposition: A | Discharge status: Home / Self Care | Payer: BLUE CROSS/BLUE SHIELD | Attending: Emergency Medicine | Admitting: Emergency Medicine

## 2015-04-29 ENCOUNTER — Emergency Department: Payer: BLUE CROSS/BLUE SHIELD

## 2015-04-29 ENCOUNTER — Other Ambulatory Visit: Payer: Self-pay

## 2015-04-29 DIAGNOSIS — I10 Essential (primary) hypertension: Secondary | ICD-10-CM | POA: Insufficient documentation

## 2015-04-29 DIAGNOSIS — R079 Chest pain, unspecified: Secondary | ICD-10-CM | POA: Diagnosis present

## 2015-04-29 DIAGNOSIS — I2699 Other pulmonary embolism without acute cor pulmonale: Secondary | ICD-10-CM | POA: Diagnosis not present

## 2015-04-29 DIAGNOSIS — Z87891 Personal history of nicotine dependence: Secondary | ICD-10-CM | POA: Insufficient documentation

## 2015-04-29 DIAGNOSIS — Z79899 Other long term (current) drug therapy: Secondary | ICD-10-CM | POA: Diagnosis not present

## 2015-04-29 HISTORY — DX: Essential (primary) hypertension: I10

## 2015-04-29 LAB — CBC WITH DIFFERENTIAL/PLATELET
Basophils Absolute: 0.1 10*3/uL (ref 0–0.1)
Basophils Relative: 1 %
Eosinophils Absolute: 0.1 10*3/uL (ref 0–0.7)
Eosinophils Relative: 2 %
HEMATOCRIT: 42.5 % (ref 35.0–47.0)
Hemoglobin: 14.2 g/dL (ref 12.0–16.0)
LYMPHS ABS: 2.1 10*3/uL (ref 1.0–3.6)
LYMPHS PCT: 28 %
MCH: 29.7 pg (ref 26.0–34.0)
MCHC: 33.4 g/dL (ref 32.0–36.0)
MCV: 88.7 fL (ref 80.0–100.0)
MONO ABS: 0.4 10*3/uL (ref 0.2–0.9)
MONOS PCT: 5 %
NEUTROS ABS: 4.8 10*3/uL (ref 1.4–6.5)
Neutrophils Relative %: 64 %
Platelets: 261 10*3/uL (ref 150–440)
RBC: 4.79 MIL/uL (ref 3.80–5.20)
RDW: 13.3 % (ref 11.5–14.5)
WBC: 7.5 10*3/uL (ref 3.6–11.0)

## 2015-04-29 LAB — FIBRIN DERIVATIVES D-DIMER (ARMC ONLY): Fibrin derivatives D-dimer (ARMC): 237 (ref 0–499)

## 2015-04-29 LAB — BASIC METABOLIC PANEL
Anion gap: 8 (ref 5–15)
BUN: 9 mg/dL (ref 6–20)
CALCIUM: 9.4 mg/dL (ref 8.9–10.3)
CO2: 25 mmol/L (ref 22–32)
Chloride: 105 mmol/L (ref 101–111)
Creatinine, Ser: 0.8 mg/dL (ref 0.44–1.00)
GFR calc Af Amer: >60 mL/min (ref >60)
GFR calc non Af Amer: >60 mL/min (ref >60)
GLUCOSE: 106 mg/dL — AB (ref 65–99)
POTASSIUM: 3.6 mmol/L (ref 3.5–5.1)
Sodium: 138 mmol/L (ref 135–145)

## 2015-04-29 LAB — TROPONIN I: Troponin I: <0.03 ng/mL (ref <0.031)

## 2015-04-29 MED ORDER — IOHEXOL 350 MG/ML SOLN
100.0000 mL | Freq: Once | INTRAVENOUS | Status: AC | PRN
  Administered 2015-04-29: 100 mL via INTRAVENOUS

## 2015-04-29 NOTE — ED Notes (Addendum)
Pt c/o chest pain on inspiration, at worst 2/10, states she has a history of acid reflux. Also complaining of pain to R leg calf and thigh, 5/10. Pt states she had surgery on her gallbladder 04/04/15 and is concerned about blood clots.

## 2015-04-29 NOTE — ED Notes (Signed)
Developed mid chest pain yesterday  Pain increases with inspiration .Also, she has additional complaints of some SOB with exertion. S/p surgery 08/03

## 2015-04-29 NOTE — ED Provider Notes (Signed)
Time Seen: Approximately 10:30  I have reviewed the triage notes  Chief Complaint: Chest Pain   History of Present Illness: Tiffany Jackson is a 34 y.o. female who presents with feelings of chest discomfort especially deep inspiration. She states she has a history of reflux who was concerned because she had some surgery for gallbladder 0.8316. Patient states she had an uneventful cholecystectomy but was concerned because of the pleuritic nature of her pain and was some discussion about possible pulmonary embolism. He states he had some discomfort in the back of her right leg. Patient denies any swelling of her lower extremity and states occasionally the pain is worse with movement. She denies any left-sided complaints or any fever. She denies any productive cough or wheezing. She states she's been seen and evaluated for tachycardia in the past and seems to be essentially anxiety from white coat syndrome when she was in medical situations. Has a device that checks her pulse at home which seems to match R monitor and states that usually her pulse rates in the 80s at home.   Past Medical History  Diagnosis Date  . Depression   . Allergy     Seasonal  . Chicken pox   . Frequent headaches   . Migraines   . Hypertension     Patient Active Problem List   Diagnosis Date Noted  . Benign essential HTN 04/13/2015  . Nausea with vomiting 04/03/2015  . RUQ abdominal tenderness 04/03/2015  . Acute cholecystitis 04/03/2015  . Encounter to establish care 03/30/2015  . Depression 03/30/2015  . Frequent headaches 03/30/2015  . Migraines 03/30/2015  . Seasonal allergies 03/30/2015  . Sinusitis, acute 03/30/2015    Past Surgical History  Procedure Laterality Date  . Cesarean section      2003, 2005, 2009  . Cholecystectomy N/A 04/04/2015    Procedure: LAPAROSCOPIC CHOLECYSTECTOMY;  Surgeon: Natale Lay, MD;  Location: [facility] ORS;  Service: General;  Laterality: N/A;    Past Surgical History   Procedure Laterality Date  . Cesarean section      2003, 2005, 2009  . Cholecystectomy N/A 04/04/2015    Procedure: LAPAROSCOPIC CHOLECYSTECTOMY;  Surgeon: Natale Lay, MD;  Location: [facility] ORS;  Service: General;  Laterality: N/A;    Current Outpatient Rx  Name  Route  Sig  Dispense  Refill  . ALPRAZolam (XANAX) 0.25 MG tablet   Oral   Take 1 tablet (0.25 mg total) by mouth at bedtime as needed for anxiety.   30 tablet   0   . amLODipine (NORVASC) 5 MG tablet   Oral   Take 1 tablet (5 mg total) by mouth daily.   30 tablet   0   . omeprazole (PRILOSEC) 40 MG capsule   Oral   Take 1 capsule (40 mg total) by mouth daily.   30 capsule   0   . ondansetron (ZOFRAN) 4 MG tablet      TAKE 1 TABLET(4 MG) BY MOUTH EVERY 8 HOURS AS NEEDED FOR NAUSEA OR VOMITING   20 tablet   0   . venlafaxine XR (EFFEXOR-XR) 37.5 MG 24 hr capsule   Oral   Take 1 capsule (37.5 mg total) by mouth daily with breakfast.   30 capsule   1   . dicyclomine (BENTYL) 10 MG capsule      Take up to capsules three times a day.   90 capsule   0     Allergies:  Review of patient's allergies indicates  no known allergies.  Family History: Family History  Problem Relation Age of Onset  . Multiple sclerosis Mother   . Hypertension Father   . Cancer Maternal Grandmother     Breast Cancer  . Cancer Paternal Grandfather     Lung Cancer  . Heart disease Daughter     Social History: Social History  Substance Use Topics  . Smoking status: Former Smoker    Types: Cigarettes    Quit date: 07/02/2013  . Smokeless tobacco: Never Used  . Alcohol Use: No     Review of Systems:   10 point review of systems was performed and was otherwise negative:  Constitutional: No fever Eyes: No visual disturbances ENT: No sore throat, ear pain Cardiac: No chest pain Respiratory: No shortness of breath, wheezing, or stridor Abdomen: No abdominal pain, no vomiting, No diarrhea Endocrine: No weight loss, No  night sweats Extremities: No peripheral edema, cyanosis Skin: No rashes, easy bruising Neurologic: No focal weakness, trouble with speech or swollowing Urologic: No dysuria, Hematuria, or urinary frequency   Physical Exam:  ED Triage Vitals  Enc Vitals Group     BP 04/29/15 1021 148/88 mmHg     Pulse Rate 04/29/15 1021 113     Resp 04/29/15 1021 20     Temp 04/29/15 1021 98 F (36.7 C)     Temp Source 04/29/15 1021 Oral     SpO2 04/29/15 1021 98 %     Weight 04/29/15 1020 220 lb (99.791 kg)     Height 04/29/15 1020 5\' 3"  (1.6 m)     Head Cir --      Peak Flow --      Pain Score 04/29/15 1019 2     Pain Loc --      Pain Edu? --      Excl. in GC? --     General: Awake , Alert , and Oriented times 3; GCS 15 Head: Normal cephalic , atraumatic Eyes: Pupils equal , round, reactive to light Nose/Throat: No nasal drainage, patent upper airway without erythema or exudate.  Neck: Supple, Full range of motion, No anterior adenopathy or palpable thyroid masses Lungs: Clear to ascultation without wheezes , rhonchi, or rales Heart: Tachycardia without murmurs , gallops , or rubs Abdomen: Soft, non tender without rebound, guarding , or rigidity; bowel sounds positive and symmetric in all 4 quadrants. No organomegaly .        Extremities: 2 plus symmetric pulses. No edema, clubbing or cyanosis. No difference in size on visual inspection between the left and right lower extremity. Negative Homans sign Neurologic: normal ambulation, Motor symmetric without deficits, sensory intact Skin: warm, dry, no rashes   Labs:   All laboratory work was reviewed including any pertinent negatives or positives listed below:  Labs Reviewed  BASIC METABOLIC PANEL - Abnormal; Notable for the following:    Glucose, Bld 106 (*)    All other components within normal limits  CBC WITH DIFFERENTIAL/PLATELET  FIBRIN DERIVATIVES D-DIMER ([facility] ONLY)  TROPONIN I   review laboratory work shows a negative d-dimer  negative troponin  EKG:  ED ECG REPORT I, Jennye Moccasin, the attending physician, personally viewed and interpreted this ECG.  Date: 04/29/2015 EKG Time: 1021 Rate: 123 Rhythm: normal sinus rhythm QRS Axis: normal Intervals: normal ST/T Wave abnormalities: Nonspecific ST and T wave abnormality which may be rate dependent Conduction Disutrbances: none Narrative Interpretation: unremarkable No obvious acute ischemic changes   Radiology:    CT  ANGIOGRAPHY CHEST WITH CONTRAST  TECHNIQUE: Multidetector CT imaging of the chest was performed using the standard protocol during bolus administration of intravenous contrast. Multiplanar CT image reconstructions and MIPs were obtained to evaluate the vascular anatomy.  CONTRAST: OMNIPAQUE IOHEXOL 350 MG/ML SOLN  COMPARISON: 04/05/2015  FINDINGS: No filling defects in the pulmonary arteries to suggest pulmonary emboli. Heart is normal size. Aorta is normal caliber. No mediastinal, hilar, or axillary adenopathy. Chest wall soft tissues are unremarkable.  Lungs are clear. No focal airspace opacities or suspicious nodules. No effusions. Imaging into the upper abdomen shows no acute findings. Diffuse fatty infiltration of the liver. No acute bony abnormality or focal bone lesion.  Review of the MIP images confirms the above findings.  IMPRESSION: No evidence of pulmonary embolus.      I personally reviewed the radiologic studies   Procedures: None     ED Course:  Patient's differential includes pulmonary embolism, community-acquired pneumonia, pneumothorax, etc. Given her current clinical presentation and objective findings not sure why she is feeling short of breath at this time but does not appear to be a pulmonary embolism with a negative chest CT and negative D-dimer test. We agreed that the discomfort in the back of her right leg is unlikely to be a significant deep venous thrombosis given the negative  nature of the above two mentioned studies. She was advised to return here if she notes any swelling in her lower extremity or any other new concerns such as increased pain or erythema etc.    Assessment:  Dyspnea Anxiety   Final Clinical Impression: Rule out Final diagnoses:  Pulmonary embolism     Plan: Patient was advised to return immediately if condition worsens. Patient was advised to follow up with her primary care physician or other specialized physicians involved and in their current assessment.             Jennye Moccasin, MD 04/29/15 [phone]

## 2015-04-29 NOTE — Assessment & Plan Note (Signed)
Worsening. Will restart pt on Effexor and add xanax prn for severe anxiety until Effexor is up to par in her system. Discussed this takes 4-6 weeks. Follow up in 4-6 weeks.

## 2015-05-01 ENCOUNTER — Ambulatory Visit (INDEPENDENT_AMBULATORY_CARE_PROVIDER_SITE_OTHER): Payer: BLUE CROSS/BLUE SHIELD | Admitting: Nurse Practitioner

## 2015-05-01 VITALS — BP 122/84 | HR 113 | Temp 98.1°F | Resp 16 | Ht 63.0 in | Wt 218.2 lb

## 2015-05-01 DIAGNOSIS — R0602 Shortness of breath: Secondary | ICD-10-CM | POA: Diagnosis not present

## 2015-05-01 DIAGNOSIS — F418 Other specified anxiety disorders: Secondary | ICD-10-CM

## 2015-05-01 DIAGNOSIS — R112 Nausea with vomiting, unspecified: Secondary | ICD-10-CM

## 2015-05-01 MED ORDER — ALBUTEROL SULFATE HFA 108 (90 BASE) MCG/ACT IN AERS: 2.0000 | INHALATION_SPRAY | RESPIRATORY_TRACT | Status: AC | PRN

## 2015-05-01 NOTE — Progress Notes (Signed)
Pre visit review using our clinic review tool, if applicable. No additional management support is needed unless otherwise documented below in the visit note. 

## 2015-05-01 NOTE — Progress Notes (Signed)
Patient ID: Tiffany Jackson, female    DOB: [birth year]  Age: 34 y.o. MRN: [id number]  CC: ER Follow Up   HPI Tiffany Jackson presents for ER follow up for SOB and chest pain on left.   1) Shortness of breath Saturday morning bad went to ER. They reviewed her cholecystectomy and they checked for PE 04/05/15 and 04/29/15 by CT.   2) Leg pulling sensation- improving   3) Still hurting to breathe on left chest- Ruled out PE in ER. Anxiety and dyspnea were the diagnoses.   4) Still vomiting in the morning-  Not every morning   History Tiffany Jackson has a past medical history of Depression; Allergy; Chicken pox; Frequent headaches; Migraines; and Hypertension.   She has past surgical history that includes Cesarean section and Cholecystectomy (N/A, 04/04/2015).   Her family history includes Cancer in her maternal grandmother and paternal grandfather; Heart disease in her daughter; Hypertension in her father; Multiple sclerosis in her mother.She reports that she quit smoking about 22 months ago. Her smoking use included Cigarettes. She has never used smokeless tobacco. She reports that she does not drink alcohol or use illicit drugs.  Outpatient Prescriptions Prior to Visit  Medication Sig Dispense Refill  . ALPRAZolam (XANAX) 0.25 MG tablet Take 1 tablet (0.25 mg total) by mouth at bedtime as needed for anxiety. 30 tablet 0  . dicyclomine (BENTYL) 10 MG capsule Take up to capsules three times a day. 90 capsule 0  . omeprazole (PRILOSEC) 40 MG capsule Take 1 capsule (40 mg total) by mouth daily. 30 capsule 0  . ondansetron (ZOFRAN) 4 MG tablet TAKE 1 TABLET(4 MG) BY MOUTH EVERY 8 HOURS AS NEEDED FOR NAUSEA OR VOMITING 20 tablet 0  . venlafaxine XR (EFFEXOR-XR) 37.5 MG 24 hr capsule Take 1 capsule (37.5 mg total) by mouth daily with breakfast. 30 capsule 1  . amLODipine (NORVASC) 5 MG tablet Take 1 tablet (5 mg total) by mouth daily. 30 tablet 0   No facility-administered medications prior to visit.     ROS Review of Systems  Constitutional: Negative for fever, chills, diaphoresis and fatigue.  Respiratory: Positive for shortness of breath. Negative for chest tightness and wheezing.   Cardiovascular: Negative for chest pain, palpitations and leg swelling.  Gastrointestinal: Positive for vomiting. Negative for nausea and diarrhea.  Musculoskeletal: Positive for myalgias.  Skin: Negative for rash.  Neurological: Negative for dizziness, weakness, numbness and headaches.  Psychiatric/Behavioral: The patient is nervous/anxious.     Objective:  BP 122/84 mmHg  Pulse 113  Temp(Src) 98.1 F (36.7 C)  Resp 16  Ht 5\' 3"  (1.6 m)  Wt 218 lb 3.2 oz (98.975 kg)  BMI 38.66 kg/m2  SpO2 97%  LMP 04/03/2015 (Exact Date)  Physical Exam  Constitutional: She is oriented to person, place, and time. She appears well-developed and well-nourished. No distress.  HENT:  Head: Normocephalic and atraumatic.  Right Ear: External ear normal.  Left Ear: External ear normal.  Cardiovascular: Normal rate, regular rhythm and normal heart sounds.  Exam reveals no gallop and no friction rub.   No murmur heard. Pulmonary/Chest: Effort normal. No respiratory distress. She has wheezes. She has no rales. She exhibits no tenderness.  Abdominal: Soft. Bowel sounds are normal. She exhibits no distension and no mass. There is no tenderness. There is no rebound and no guarding.  Neurological: She is alert and oriented to person, place, and time. No cranial nerve deficit. She exhibits normal muscle tone. Coordination normal.  Skin: Skin  is warm and dry. No rash noted. She is not diaphoretic.  Psychiatric: She has a normal mood and affect. Her behavior is normal. Judgment and thought content normal.   Assessment & Plan:   Tiffany Jackson was seen today for er follow up.  Diagnoses and all orders for this visit:  Non-intractable vomiting with nausea, vomiting of unspecified type  Depression with anxiety  SOB  (shortness of breath)  Other orders -     albuterol (PROVENTIL HFA;VENTOLIN HFA) 108 (90 BASE) MCG/ACT inhaler; Inhale 2 puffs into the lungs as needed for wheezing or shortness of breath.  I am having Ms. Valdes start on albuterol. I am also having her maintain her omeprazole, dicyclomine, venlafaxine XR, ALPRAZolam, and ondansetron.  Meds ordered this encounter  Medications  . albuterol (PROVENTIL HFA;VENTOLIN HFA) 108 (90 BASE) MCG/ACT inhaler    Sig: Inhale 2 puffs into the lungs as needed for wheezing or shortness of breath.    Dispense:  1 Inhaler    Refill:  1    Order Specific Question:  Supervising Provider    Answer:  Sherlene Shams [[id number]]     Follow-up: Return in about 2 weeks (around 05/15/2015).

## 2015-05-01 NOTE — Patient Instructions (Signed)
When feeling short of breath- 1-2 puffs daily as needed   Xanax- try taking 1 in the morning and 1 at night.   Effexor- take at night  Continue to follow up with me in 2 weeks

## 2015-05-10 ENCOUNTER — Other Ambulatory Visit: Payer: Self-pay | Admitting: Nurse Practitioner

## 2015-05-11 ENCOUNTER — Encounter: Payer: BLUE CROSS/BLUE SHIELD | Admitting: Nurse Practitioner

## 2015-05-15 ENCOUNTER — Other Ambulatory Visit (HOSPITAL_COMMUNITY)
Admission: RE | Admit: 2015-05-15 | Discharge: 2015-05-15 | Disposition: A | Discharge status: Home / Self Care | Payer: BLUE CROSS/BLUE SHIELD | Source: Ambulatory Visit | Attending: Nurse Practitioner | Admitting: Nurse Practitioner

## 2015-05-15 ENCOUNTER — Encounter: Payer: Self-pay | Admitting: Nurse Practitioner

## 2015-05-15 ENCOUNTER — Other Ambulatory Visit: Payer: Self-pay | Admitting: Nurse Practitioner

## 2015-05-15 ENCOUNTER — Ambulatory Visit (INDEPENDENT_AMBULATORY_CARE_PROVIDER_SITE_OTHER): Payer: BLUE CROSS/BLUE SHIELD | Admitting: Nurse Practitioner

## 2015-05-15 VITALS — BP 120/90 | HR 88 | Temp 98.0°F | Resp 14 | Ht 63.0 in | Wt 220.2 lb

## 2015-05-15 DIAGNOSIS — Z1329 Encounter for screening for other suspected endocrine disorder: Secondary | ICD-10-CM | POA: Diagnosis not present

## 2015-05-15 DIAGNOSIS — E538 Deficiency of other specified B group vitamins: Secondary | ICD-10-CM

## 2015-05-15 DIAGNOSIS — F418 Other specified anxiety disorders: Secondary | ICD-10-CM

## 2015-05-15 DIAGNOSIS — E559 Vitamin D deficiency, unspecified: Secondary | ICD-10-CM

## 2015-05-15 DIAGNOSIS — Z01419 Encounter for gynecological examination (general) (routine) without abnormal findings: Secondary | ICD-10-CM | POA: Diagnosis present

## 2015-05-15 DIAGNOSIS — Z1151 Encounter for screening for human papillomavirus (HPV): Secondary | ICD-10-CM | POA: Diagnosis present

## 2015-05-15 DIAGNOSIS — Z13220 Encounter for screening for lipoid disorders: Secondary | ICD-10-CM | POA: Diagnosis not present

## 2015-05-15 DIAGNOSIS — Z0000 Encounter for general adult medical examination without abnormal findings: Secondary | ICD-10-CM | POA: Diagnosis not present

## 2015-05-15 DIAGNOSIS — R0602 Shortness of breath: Secondary | ICD-10-CM | POA: Insufficient documentation

## 2015-05-15 LAB — VITAMIN D 25 HYDROXY (VIT D DEFICIENCY, FRACTURES): VITD: 20.38 ng/mL — ABNORMAL LOW (ref 30.00–100.00)

## 2015-05-15 LAB — LIPID PANEL
Cholesterol: 165 mg/dL (ref 0–200)
HDL: 33.7 mg/dL — ABNORMAL LOW (ref >39.00)
LDL Cholesterol: 110 mg/dL — ABNORMAL HIGH (ref 0–99)
NonHDL: 131.76
TRIGLYCERIDES: 107 mg/dL (ref 0.0–149.0)
Total CHOL/HDL Ratio: 5
VLDL: 21.4 mg/dL (ref 0.0–40.0)

## 2015-05-15 LAB — T4, FREE: FREE T4: 0.9 ng/dL (ref 0.60–1.60)

## 2015-05-15 LAB — TSH: TSH: 0.77 u[IU]/mL (ref 0.35–4.50)

## 2015-05-15 LAB — VITAMIN B12: Vitamin B-12: 224 pg/mL (ref 211–911)

## 2015-05-15 MED ORDER — VENLAFAXINE HCL ER 75 MG PO CP24: 75.0000 mg | ORAL_CAPSULE | Freq: Every day | ORAL | Status: DC

## 2015-05-15 MED ORDER — ALPRAZOLAM 0.25 MG PO TABS: 0.2500 mg | ORAL_TABLET | Freq: Two times a day (BID) | ORAL | Status: DC | PRN

## 2015-05-15 NOTE — Assessment & Plan Note (Signed)
Pt is still having anxiety. Follow up at physical.

## 2015-05-15 NOTE — Assessment & Plan Note (Addendum)
Unsure if psychogenic in nature. Discussed possible GI visit. Zofran to continue prn. Xanax 0.25 mg in the morning and evening for anxiety. FU prn worsening/failure to improve.

## 2015-05-15 NOTE — Patient Instructions (Signed)
We will try the 75 mg once daily of Effexor Continue with Tums Xanax prescription was sent to your pharmacy.   Health Maintenance Adopting a healthy lifestyle and getting preventive care can go a long way to promote health and wellness. Talk with your health care provider about what schedule of regular examinations is right for you. This is a good chance for you to check in with your provider about disease prevention and staying healthy. In between checkups, there are plenty of things you can do on your own. Experts have done a lot of research about which lifestyle changes and preventive measures are most likely to keep you healthy. Ask your health care provider for more information. WEIGHT AND DIET  Eat a healthy diet  Be sure to include plenty of vegetables, fruits, low-fat dairy products, and lean protein.  Do not eat a lot of foods high in solid fats, added sugars, or salt.  Get regular exercise. This is one of the most important things you can do for your health.  Most adults should exercise for at least 150 minutes each week. The exercise should increase your heart rate and make you sweat (moderate-intensity exercise).  Most adults should also do strengthening exercises at least twice a week. This is in addition to the moderate-intensity exercise.  Maintain a healthy weight  Body mass index (BMI) is a measurement that can be used to identify possible weight problems. It estimates body fat based on height and weight. Your health care provider can help determine your BMI and help you achieve or maintain a healthy weight.  For females 81 years of age and older:   A BMI below 18.5 is considered underweight.  A BMI of 18.5 to 24.9 is normal.  A BMI of 25 to 29.9 is considered overweight.  A BMI of 30 and above is considered obese.  Watch levels of cholesterol and blood lipids  You should start having your blood tested for lipids and cholesterol at 33 years of age, then have this  test every 5 years.  You may need to have your cholesterol levels checked more often if:  Your lipid or cholesterol levels are high.  You are older than 33 years of age.  You are at high risk for heart disease.  CANCER SCREENING   Lung Cancer  Lung cancer screening is recommended for adults 39-56 years old who are at high risk for lung cancer because of a history of smoking.  A yearly low-dose CT scan of the lungs is recommended for people who:  Currently smoke.  Have quit within the past 15 years.  Have at least a 30-pack-year history of smoking. A pack year is smoking an average of one pack of cigarettes a day for 1 year.  Yearly screening should continue until it has been 15 years since you quit.  Yearly screening should stop if you develop a health problem that would prevent you from having lung cancer treatment.  Breast Cancer  Practice breast self-awareness. This means understanding how your breasts normally appear and feel.  It also means doing regular breast self-exams. Let your health care provider know about any changes, no matter how small.  If you are in your 20s or 30s, you should have a clinical breast exam (CBE) by a health care provider every 1-3 years as part of a regular health exam.  If you are 43 or older, have a CBE every year. Also consider having a breast X-ray (mammogram) every year.  If you have a family history of breast cancer, talk to your health care provider about genetic screening.  If you are at high risk for breast cancer, talk to your health care provider about having an MRI and a mammogram every year.  Breast cancer gene (BRCA) assessment is recommended for women who have family members with BRCA-related cancers. BRCA-related cancers include:  Breast.  Ovarian.  Tubal.  Peritoneal cancers.  Results of the assessment will determine the need for genetic counseling and BRCA1 and BRCA2 testing. Cervical Cancer Routine pelvic  examinations to screen for cervical cancer are no longer recommended for nonpregnant women who are considered low risk for cancer of the pelvic organs (ovaries, uterus, and vagina) and who do not have symptoms. A pelvic examination may be necessary if you have symptoms including those associated with pelvic infections. Ask your health care provider if a screening pelvic exam is right for you.   The Pap test is the screening test for cervical cancer for women who are considered at risk.  If you had a hysterectomy for a problem that was not cancer or a condition that could lead to cancer, then you no longer need Pap tests.  If you are older than 65 years, and you have had normal Pap tests for the past 10 years, you no longer need to have Pap tests.  If you have had past treatment for cervical cancer or a condition that could lead to cancer, you need Pap tests and screening for cancer for at least 20 years after your treatment.  If you no longer get a Pap test, assess your risk factors if they change (such as having a new sexual partner). This can affect whether you should start being screened again.  Some women have medical problems that increase their chance of getting cervical cancer. If this is the case for you, your health care provider may recommend more frequent screening and Pap tests.  The human papillomavirus (HPV) test is another test that may be used for cervical cancer screening. The HPV test looks for the virus that can cause cell changes in the cervix. The cells collected during the Pap test can be tested for HPV.  The HPV test can be used to screen women 64 years of age and older. Getting tested for HPV can extend the interval between normal Pap tests from three to five years.  An HPV test also should be used to screen women of any age who have unclear Pap test results.  After 33 years of age, women should have HPV testing as often as Pap tests.  Colorectal Cancer  This type of  cancer can be detected and often prevented.  Routine colorectal cancer screening usually begins at 33 years of age and continues through 34 years of age.  Your health care provider may recommend screening at an earlier age if you have risk factors for colon cancer.  Your health care provider may also recommend using home test kits to check for hidden blood in the stool.  A small camera at the end of a tube can be used to examine your colon directly (sigmoidoscopy or colonoscopy). This is done to check for the earliest forms of colorectal cancer.  Routine screening usually begins at age 48.  Direct examination of the colon should be repeated every 5-10 years through 33 years of age. However, you may need to be screened more often if early forms of precancerous polyps or small growths are found. Skin Cancer  Check your skin from head to toe regularly.  Tell your health care provider about any new moles or changes in moles, especially if there is a change in a mole's shape or color.  Also tell your health care provider if you have a mole that is larger than the size of a pencil eraser.  Always use sunscreen. Apply sunscreen liberally and repeatedly throughout the day.  Protect yourself by wearing long sleeves, pants, a wide-brimmed hat, and sunglasses whenever you are outside. HEART DISEASE, DIABETES, AND HIGH BLOOD PRESSURE   Have your blood pressure checked at least every 1-2 years. High blood pressure causes heart disease and increases the risk of stroke.  If you are between 50 years and 48 years old, ask your health care provider if you should take aspirin to prevent strokes.  Have regular diabetes screenings. This involves taking a blood sample to check your fasting blood sugar level.  If you are at a normal weight and have a low risk for diabetes, have this test once every three years after 33 years of age.  If you are overweight and have a high risk for diabetes, consider being  tested at a younger age or more often. PREVENTING INFECTION  Hepatitis B  If you have a higher risk for hepatitis B, you should be screened for this virus. You are considered at high risk for hepatitis B if:  You were born in a country where hepatitis B is common. Ask your health care provider which countries are considered high risk.  Your parents were born in a high-risk country, and you have not been immunized against hepatitis B (hepatitis B vaccine).  You have HIV or AIDS.  You use needles to inject street drugs.  You live with someone who has hepatitis B.  You have had sex with someone who has hepatitis B.  You get hemodialysis treatment.  You take certain medicines for conditions, including cancer, organ transplantation, and autoimmune conditions. Hepatitis C  Blood testing is recommended for:  Everyone born from [birth year] through 1964.  Anyone with known risk factors for hepatitis C. Sexually transmitted infections (STIs)  You should be screened for sexually transmitted infections (STIs) including gonorrhea and chlamydia if:  You are sexually active and are younger than 33 years of age.  You are older than 33 years of age and your health care provider tells you that you are at risk for this type of infection.  Your sexual activity has changed since you were last screened and you are at an increased risk for chlamydia or gonorrhea. Ask your health care provider if you are at risk.  If you do not have HIV, but are at risk, it may be recommended that you take a prescription medicine daily to prevent HIV infection. This is called pre-exposure prophylaxis (PrEP). You are considered at risk if:  You are sexually active and do not regularly use condoms or know the HIV status of your partner(s).  You take drugs by injection.  You are sexually active with a partner who has HIV. Talk with your health care provider about whether you are at high risk of being infected with HIV. If  you choose to begin PrEP, you should first be tested for HIV. You should then be tested every 3 months for as long as you are taking PrEP.  PREGNANCY   If you are premenopausal and you may become pregnant, ask your health care provider about preconception counseling.  If you may become pregnant,  take 400 to 800 micrograms (mcg) of folic acid every day.  If you want to prevent pregnancy, talk to your health care provider about birth control (contraception). OSTEOPOROSIS AND MENOPAUSE   Osteoporosis is a disease in which the bones lose minerals and strength with aging. This can result in serious bone fractures. Your risk for osteoporosis can be identified using a bone density scan.  If you are 10 years of age or older, or if you are at risk for osteoporosis and fractures, ask your health care provider if you should be screened.  Ask your health care provider whether you should take a calcium or vitamin D supplement to lower your risk for osteoporosis.  Menopause may have certain physical symptoms and risks.  Hormone replacement therapy may reduce some of these symptoms and risks. Talk to your health care provider about whether hormone replacement therapy is right for you.  HOME CARE INSTRUCTIONS   Schedule regular health, dental, and eye exams.  Stay current with your immunizations.   Do not use any tobacco products including cigarettes, chewing tobacco, or electronic cigarettes.  If you are pregnant, do not drink alcohol.  If you are breastfeeding, limit how much and how often you drink alcohol.  Limit alcohol intake to no more than 1 drink per day for nonpregnant women. One drink equals 12 ounces of beer, 5 ounces of wine, or 1 ounces of hard liquor.  Do not use street drugs.  Do not share needles.  Ask your health care provider for help if you need support or information about quitting drugs.  Tell your health care provider if you often feel depressed.  Tell your health  care provider if you have ever been abused or do not feel safe at home. Document Released: 03/03/2011 Document Revised: 01/02/2014 Document Reviewed: 07/20/2013 [facility] Patient Information 2015 [place], [place]. This information is not intended to replace advice given to you by your health care provider. Make sure you discuss any questions you have with your health care provider.

## 2015-05-15 NOTE — Assessment & Plan Note (Signed)
Discussed acute and chronic issues. Reviewed health maintenance measures, PFSHx, and immunizations. Obtain routine labs TSH, Lipid panel, Vitamin B12 and Vitamin D.   Pt refused flu vaccination today  PAP obtained today Clinical breast exam normal today

## 2015-05-15 NOTE — Progress Notes (Signed)
Patient ID: Tiffany Jackson, female    DOB: 01/16/1981  Age: 34 y.o. MRN: [id number]  CC: Annual Exam   HPI [name] presents for her annual physical exam.   1) Health Maintenance-   Diet- Cut down on fried foods and caffeine   Exercise- No formal   Immunizations- tdap UTD; Flu- Refuses   Pap- 2008; will obtain update today  Eye Exam- Due this month  Dental Exam- UTD  2) Chronic Problems-  Anxiety- Stable currently, requesting refill of Xanax.   Vomiting- Stopped 2 weeks ago   Still having left chest pain. Lays down and goes away, if she doesn't think about it she reports it doesn't bother her, occasionally has positional tingling/numbness in hands and feet that resolve with change of position.   History [name] has a past medical history of Depression; Allergy; Chicken pox; Frequent headaches; Migraines; and Hypertension.   She has past surgical history that includes Cesarean section and Cholecystectomy (N/A, 04/04/2015).   Her family history includes Anuerysm in her paternal grandmother; Cancer in her maternal grandmother and paternal grandfather; Heart disease in her daughter; Hypertension in her father; Multiple sclerosis in her mother.She reports that she quit smoking about 22 months ago. Her smoking use included Cigarettes. She has never used smokeless tobacco. She reports that she does not drink alcohol or use illicit drugs.  Outpatient Prescriptions Prior to Visit  Medication Sig Dispense Refill  . albuterol (PROVENTIL HFA;VENTOLIN HFA) 108 (90 BASE) MCG/ACT inhaler Inhale 2 puffs into the lungs as needed for wheezing or shortness of breath. 1 Inhaler 1  . amLODipine (NORVASC) 5 MG tablet TAKE 1 TABLET(5 MG) BY MOUTH DAILY 30 tablet 1  . dicyclomine (BENTYL) 10 MG capsule Take up to capsules three times a day. 90 capsule 0  . ondansetron (ZOFRAN) 4 MG tablet TAKE 1 TABLET(4 MG) BY MOUTH EVERY 8 HOURS AS NEEDED FOR NAUSEA OR VOMITING 20 tablet 0  . ALPRAZolam (XANAX) 0.25 MG  tablet Take 1 tablet (0.25 mg total) by mouth at bedtime as needed for anxiety. 30 tablet 0  . omeprazole (PRILOSEC) 40 MG capsule Take 1 capsule (40 mg total) by mouth daily. 30 capsule 0  . venlafaxine XR (EFFEXOR-XR) 37.5 MG 24 hr capsule Take 1 capsule (37.5 mg total) by mouth daily with breakfast. 30 capsule 1   No facility-administered medications prior to visit.    ROS Review of Systems  Constitutional: Negative for fever, chills, diaphoresis and fatigue.  Respiratory: Negative for chest tightness, shortness of breath and wheezing.   Cardiovascular: Positive for chest pain. Negative for palpitations and leg swelling.       Chest pain on left and right with movement of arms  Gastrointestinal: Negative for nausea, vomiting and diarrhea.  Skin: Negative for rash.  Neurological: Positive for numbness. Negative for dizziness, weakness and headaches.  Psychiatric/Behavioral: The patient is nervous/anxious.     Objective:  BP 120/90 mmHg  Pulse 88  Temp(Src) 98 F (36.7 C)  Resp 14  Ht 5\' 3"  (1.6 m)  Wt 220 lb 3.2 oz (99.882 kg)  BMI 39.02 kg/m2  SpO2 98%  Physical Exam  Constitutional: She is oriented to person, place, and time. She appears well-developed and well-nourished. No distress.  HENT:  Head: Normocephalic and atraumatic.  Right Ear: External ear normal.  Left Ear: External ear normal.  Hirsutism   Eyes: EOM are normal. Pupils are equal, round, and reactive to light. Right eye exhibits no discharge. Left eye exhibits no discharge.  No scleral icterus.  Neck: Normal range of motion. Neck supple. No thyromegaly present.  Cardiovascular: Normal rate, regular rhythm, normal heart sounds and intact distal pulses.  Exam reveals no gallop and no friction rub.   No murmur heard. Pulmonary/Chest: Effort normal and breath sounds normal. No respiratory distress. She has no wheezes. She has no rales. She exhibits no tenderness.  Clinical breast exam bilaterally, dense fibrous  breast tissue, no significant findings  Abdominal: Soft. Bowel sounds are normal. She exhibits no distension and no mass. There is no tenderness. There is no rebound and no guarding.  Genitourinary: Vagina normal. Guaiac negative stool. No vaginal discharge found.  Musculoskeletal: Normal range of motion. She exhibits no edema or tenderness.  Lymphadenopathy:    She has no cervical adenopathy.  Neurological: She is alert and oriented to person, place, and time. No cranial nerve deficit. She exhibits normal muscle tone. Coordination normal.  Skin: Skin is warm and dry. No rash noted. She is not diaphoretic.  Psychiatric: She has a normal mood and affect. Her behavior is normal. Judgment and thought content normal.  Anxious   Assessment & Plan:   Shahana was seen today for annual exam.  Diagnoses and all orders for this visit:  Screening for hyperlipidemia -     Lipid Profile -     Cytology - PAP  Screening for thyroid disorder -     TSH -     T4, free  Vitamin D deficiency -     Vitamin D (25 hydroxy)  Vitamin B12 deficiency -     B12  Routine general medical examination at a health care facility  Other orders -     venlafaxine XR (EFFEXOR XR) 75 MG 24 hr capsule; Take 1 capsule (75 mg total) by mouth daily with breakfast. -     ALPRAZolam (XANAX) 0.25 MG tablet; Take 1 tablet (0.25 mg total) by mouth 2 (two) times daily as needed for anxiety.   I have discontinued Ms. Eifler's omeprazole and venlafaxine XR. I have also changed her ALPRAZolam. Additionally, I am having her start on venlafaxine XR. Lastly, I am having her maintain her dicyclomine, ondansetron, albuterol, and amLODipine.  Meds ordered this encounter  Medications  . venlafaxine XR (EFFEXOR XR) 75 MG 24 hr capsule    Sig: Take 1 capsule (75 mg total) by mouth daily with breakfast.    Dispense:  30 capsule    Refill:  2    Order Specific Question:  Supervising Provider    Answer:  Duncan Dull L [[id number]]  .  ALPRAZolam (XANAX) 0.25 MG tablet    Sig: Take 1 tablet (0.25 mg total) by mouth 2 (two) times daily as needed for anxiety.    Dispense:  60 tablet    Refill:  0    Order Specific Question:  Supervising Provider    Answer:  Sherlene Shams [[id number]    Follow-up: Return in about 3 months (around 08/14/2015).

## 2015-05-15 NOTE — Assessment & Plan Note (Signed)
Wheeze in lower lobes. Will start pt on albuterol as needed. FU at next visit.

## 2015-05-15 NOTE — Progress Notes (Signed)
Pre visit review using our clinic review tool, if applicable. No additional management support is needed unless otherwise documented below in the visit note. 

## 2015-05-16 ENCOUNTER — Other Ambulatory Visit: Payer: Self-pay | Admitting: Nurse Practitioner

## 2015-05-16 DIAGNOSIS — R9431 Abnormal electrocardiogram [ECG] [EKG]: Secondary | ICD-10-CM

## 2015-05-17 LAB — CYTOLOGY - PAP

## 2015-05-24 NOTE — Assessment & Plan Note (Signed)
Refills given on xanax and effexor. Will follow

## 2015-06-01 ENCOUNTER — Telehealth: Payer: Self-pay

## 2015-06-01 NOTE — Telephone Encounter (Signed)
Can she take an aspirin with her other medications? Please advise.

## 2015-06-01 NOTE — Telephone Encounter (Signed)
Left message for patient

## 2015-06-01 NOTE — Telephone Encounter (Signed)
Yes. Thanks 

## 2015-06-06 ENCOUNTER — Encounter: Payer: Self-pay | Admitting: Nurse Practitioner

## 2015-07-04 ENCOUNTER — Other Ambulatory Visit: Payer: Self-pay | Admitting: Nurse Practitioner

## 2015-07-05 ENCOUNTER — Encounter: Payer: Self-pay | Admitting: Cardiovascular Disease

## 2015-07-05 ENCOUNTER — Ambulatory Visit (INDEPENDENT_AMBULATORY_CARE_PROVIDER_SITE_OTHER): Payer: BLUE CROSS/BLUE SHIELD | Admitting: Cardiovascular Disease

## 2015-07-05 VITALS — BP 130/92 | HR 86 | Ht 63.5 in | Wt 217.5 lb

## 2015-07-05 DIAGNOSIS — R0602 Shortness of breath: Secondary | ICD-10-CM | POA: Diagnosis not present

## 2015-07-05 DIAGNOSIS — R079 Chest pain, unspecified: Secondary | ICD-10-CM

## 2015-07-05 DIAGNOSIS — R000 Tachycardia, unspecified: Secondary | ICD-10-CM

## 2015-07-05 NOTE — Patient Instructions (Signed)
Medication Instructions:  Your physician recommends that you continue on your current medications as directed. Please refer to the Current Medication list given to you today.   Labwork: none  Testing/Procedures: Your physician has requested that you have an echocardiogram. Echocardiography is a painless test that uses sound waves to create images of your heart. It provides your doctor with information about the size and shape of your heart and how well your heart's chambers and valves are working. This procedure takes approximately one hour. There are no restrictions for this procedure.    Follow-Up: Your physician recommends that you schedule a follow-up appointment with Dr. Kirke CorinArida as needed.   Any Other Special Instructions Will Be Listed Below (If Applicable).     If you need a refill on your cardiac medications before your next appointment, please call your pharmacy.  Echocardiogram An echocardiogram, or echocardiography, uses sound waves (ultrasound) to produce an image of your heart. The echocardiogram is simple, painless, obtained within a short period of time, and offers valuable information to your health care provider. The images from an echocardiogram can provide information such as:  Evidence of coronary artery disease (CAD).  Heart size.  Heart muscle function.  Heart valve function.  Aneurysm detection.  Evidence of a past heart attack.  Fluid buildup around the heart.  Heart muscle thickening.  Assess heart valve function. LET [facility] Inc[facility]OUR HEALTH CARE PROVIDER KNOW ABOUT:  Any allergies you have.  All medicines you are taking, including vitamins, herbs, eye drops, creams, and over-the-counter medicines.  Previous problems you or members of your family have had with the use of anesthetics.  Any blood disorders you have.  Previous surgeries you have had.  Medical conditions you have.  Possibility of pregnancy, if this applies. BEFORE THE PROCEDURE  No  special preparation is needed. Eat and drink normally.  PROCEDURE   In order to produce an image of your heart, gel will be applied to your chest and a wand-like tool (transducer) will be moved over your chest. The gel will help transmit the sound waves from the transducer. The sound waves will harmlessly bounce off your heart to allow the heart images to be captured in real-time motion. These images will then be recorded.  You may need an IV to receive a medicine that improves the quality of the pictures. AFTER THE PROCEDURE You may return to your normal schedule including diet, activities, and medicines, unless your health care provider tells you otherwise.   This information is not intended to replace advice given to you by your health care provider. Make sure you discuss any questions you have with your health care provider.   Document Released: 08/15/2000 Document Revised: 09/08/2014 Document Reviewed: 04/25/2013 Elsevier Interactive Patient Education [employer]2016 Elsevier Inc.

## 2015-07-05 NOTE — Assessment & Plan Note (Signed)
She reports that the chest pain has resolved completely. It could have been related to gallbladder disease followed by cholecystectomy. She does complain of shortness of breath which is overall mild but has been chronic. EKG today is normal. Previous EKGs were abnormal and I wonder if there was some lead misplacement. I requested an echocardiogram to ensure no structural heart abnormalities. I do not think an ischemic cardiac evaluation is warranted at the present time especially that her symptoms improved significantly over the last month.

## 2015-07-05 NOTE — Progress Notes (Signed)
HPI  This is a pleasant 34 year old female who was referred for evaluation of chest pain and shortness of breath. She has no previous cardiac history. She has known history of hypertension, anxiety and obesity. There is no family history of coronary artery disease. She is not a smoker. In July, she started having symptoms of chest pain, abdominal pain, nausea and diarrhea. She was ultimately diagnosed with cholelithiasis and underwent cholecystectomy on August 3. Her symptoms of chest pain and shortness of breath continued. She had few EKGs done which were abnormal with inferior T wave changes. She reports that over the last month, she has felt better with no further episodes of chest pain. She has chronic exertional dyspnea. No orthopnea or PND.  No Known Allergies   Current Outpatient Prescriptions on File Prior to Visit  Medication Sig Dispense Refill  . albuterol (PROVENTIL HFA;VENTOLIN HFA) 108 (90 BASE) MCG/ACT inhaler Inhale 2 puffs into the lungs as needed for wheezing or shortness of breath. 1 Inhaler 1  . ALPRAZolam (XANAX) 0.25 MG tablet Take 1 tablet (0.25 mg total) by mouth 2 (two) times daily as needed for anxiety. 60 tablet 0  . amLODipine (NORVASC) 5 MG tablet TAKE 1 TABLET(5 MG) BY MOUTH DAILY 30 tablet 11  . venlafaxine XR (EFFEXOR XR) 75 MG 24 hr capsule Take 1 capsule (75 mg total) by mouth daily with breakfast. 30 capsule 2   No current facility-administered medications on file prior to visit.     Past Medical History  Diagnosis Date  . Depression   . Allergy     Seasonal  . Chicken pox   . Frequent headaches   . Migraines   . Hypertension      Past Surgical History  Procedure Laterality Date  . Cesarean section      2003, 2005, 2009  . Cholecystectomy N/A 04/04/2015    Procedure: LAPAROSCOPIC CHOLECYSTECTOMY;  Surgeon: Natale Lay, MD;  Location: [facility] ORS;  Service: General;  Laterality: N/A;     Family History  Problem Relation Age of Onset  . Multiple  sclerosis Mother   . Hypertension Father   . Cancer Maternal Grandmother     Breast Cancer  . Cancer Paternal Grandfather     Lung Cancer  . Heart disease Daughter   . Anuerysm Paternal Grandmother      Social History   Social History  . Marital Status: Married    Spouse Name: N/A  . Number of Children: N/A  . Years of Education: N/A   Occupational History  . Not on file.   Social History Main Topics  . Smoking status: Former Smoker    Types: Cigarettes    Quit date: 07/02/2013  . Smokeless tobacco: Never Used  . Alcohol Use: No  . Drug Use: No  . Sexual Activity:    Partners: Male    Birth Control/ Protection: Surgical     Comment: Husband   Other Topics Concern  . 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 It is negative other than that mentioned in the history of present illness.   PHYSICAL EXAM   BP 130/92 mmHg  Pulse 86  Ht 5' 3.5" (1.613 m)  Wt 217  lb 8 oz (98.657 kg)  BMI 37.92 kg/m2  Constitutional: She is oriented to person, place, and time. She appears well-developed and well-nourished. No distress.  HENT: No nasal discharge.  Head: Normocephalic and atraumatic.  Eyes: Pupils are equal and round. No discharge.  Neck: Normal range of motion. Neck supple. No JVD present. No thyromegaly present.  Cardiovascular: Normal rate, regular rhythm, normal heart sounds. Exam reveals no gallop and no friction rub. No murmur heard.  Pulmonary/Chest: Effort normal and breath sounds normal. No stridor. No respiratory distress. She has no wheezes. She has no rales. She exhibits no tenderness.  Abdominal: Soft. Bowel sounds are normal. She exhibits no distension. There is no tenderness. There is no rebound and no guarding.  Musculoskeletal: Normal range of motion. She  exhibits no edema and no tenderness.  Neurological: She is alert and oriented to person, place, and time. Coordination normal.  Skin: Skin is warm and dry. No rash noted. She is not diaphoretic. No erythema. No pallor.  Psychiatric: She has a normal mood and affect. Her behavior is normal. Judgment and thought content normal.    EKG: Normal sinus rhythm with no significant ST or T wave changes.   ASSESSMENT AND PLAN

## 2015-07-30 ENCOUNTER — Other Ambulatory Visit: Payer: Self-pay

## 2015-07-30 ENCOUNTER — Ambulatory Visit (INDEPENDENT_AMBULATORY_CARE_PROVIDER_SITE_OTHER): Payer: BLUE CROSS/BLUE SHIELD

## 2015-07-30 DIAGNOSIS — R0602 Shortness of breath: Secondary | ICD-10-CM

## 2015-08-05 ENCOUNTER — Other Ambulatory Visit: Payer: Self-pay | Admitting: Nurse Practitioner

## 2015-08-14 ENCOUNTER — Ambulatory Visit (INDEPENDENT_AMBULATORY_CARE_PROVIDER_SITE_OTHER): Payer: BLUE CROSS/BLUE SHIELD | Admitting: Nurse Practitioner

## 2015-08-14 ENCOUNTER — Encounter: Payer: Self-pay | Admitting: Nurse Practitioner

## 2015-08-14 VITALS — BP 120/80 | HR 72 | Temp 98.0°F | Wt 215.0 lb

## 2015-08-14 DIAGNOSIS — M791 Myalgia, unspecified site: Secondary | ICD-10-CM

## 2015-08-14 DIAGNOSIS — F418 Other specified anxiety disorders: Secondary | ICD-10-CM | POA: Diagnosis not present

## 2015-08-14 LAB — C-REACTIVE PROTEIN: CRP: 0.4 mg/dL — AB (ref 0.5–20.0)

## 2015-08-14 LAB — RHEUMATOID FACTOR

## 2015-08-14 MED ORDER — AMITRIPTYLINE HCL 10 MG PO TABS: 10.0000 mg | ORAL_TABLET | Freq: Every day | ORAL | Status: DC

## 2015-08-14 NOTE — Progress Notes (Signed)
Patient ID: Tiffany Jackson, female    DOB: [birth year]  Age: 34 y.o. MRN: [id number]  CC: Follow-up   HPI Tiffany Jackson presents for follow up.   1) Effexor working well for Tiffany Jackson. She reports she feels even and doing very well. Her husband and other family have commented on how happy she feels.   2) Tiffany Jackson reports intermittent MSK pain of chest, back, sides of thighs, wrists, and hands. Patient is concerned about fibromyalgia as a diagnosis.  History Tiffany Jackson has a past medical history of Depression; Allergy; Chicken pox; Frequent headaches; Migraines; and Hypertension.   She has past surgical history that includes Cesarean section and Cholecystectomy (N/A, 04/04/2015).   Her family history includes Anuerysm in her paternal grandmother; Cancer in her maternal grandmother and paternal grandfather; Heart disease in her daughter; Hypertension in her father; Multiple sclerosis in her mother.She reports that she quit smoking about 2 years ago. Her smoking use included Cigarettes. She has never used smokeless tobacco. She reports that she does not drink alcohol or use illicit drugs.  Outpatient Prescriptions Prior to Visit  Medication Sig Dispense Refill  . albuterol (PROVENTIL HFA;VENTOLIN HFA) 108 (90 BASE) MCG/ACT inhaler Inhale 2 puffs into the lungs as needed for wheezing or shortness of breath. 1 Inhaler 1  . ALPRAZolam (XANAX) 0.25 MG tablet Take 1 tablet (0.25 mg total) by mouth 2 (two) times daily as needed for anxiety. 60 tablet 0  . amLODipine (NORVASC) 5 MG tablet TAKE 1 TABLET(5 MG) BY MOUTH DAILY 30 tablet 11  . venlafaxine XR (EFFEXOR-XR) 75 MG 24 hr capsule TAKE 1 CAPSULE(75 MG) BY MOUTH DAILY WITH BREAKFAST 30 capsule 0  . Vitamin D, Cholecalciferol, 1000 UNITS TABS Take by mouth daily.     No facility-administered medications prior to visit.    ROS Review of Systems  Constitutional: Negative for fever, chills, diaphoresis and fatigue.  Respiratory: Negative for chest tightness,  shortness of breath and wheezing.   Cardiovascular: Negative for chest pain, palpitations and leg swelling.  Gastrointestinal: Negative for nausea, vomiting and diarrhea.  Musculoskeletal: Positive for myalgias. Negative for back pain, joint swelling, arthralgias, gait problem, neck pain and neck stiffness.  Skin: Negative for rash.  Neurological: Negative for dizziness, weakness, numbness and headaches.  Psychiatric/Behavioral: The patient is nervous/anxious.     Objective:  BP 120/80 mmHg  Pulse 72  Temp(Src) 98 F (36.7 C) (Oral)  Wt 215 lb (97.523 kg)  SpO2 98%  Physical Exam  Constitutional: She is oriented to person, place, and time. She appears well-developed and well-nourished. No distress.  HENT:  Head: Normocephalic and atraumatic.  Right Ear: External ear normal.  Left Ear: External ear normal.  Cardiovascular: Normal rate, regular rhythm and normal heart sounds.  Exam reveals no gallop and no friction rub.   No murmur heard. Pulmonary/Chest: Effort normal and breath sounds normal. No respiratory distress. She has no wheezes. She has no rales. She exhibits no tenderness.  Neurological: She is alert and oriented to person, place, and time. No cranial nerve deficit. She exhibits normal muscle tone. Coordination normal.  Skin: Skin is warm and dry. No rash noted. She is not diaphoretic.  Psychiatric: She has a normal mood and affect. Her behavior is normal. Judgment and thought content normal.  Patient's anxiety seems to be under control today.   Assessment & Plan:   [name] was seen today for follow-up.  Diagnoses and all orders for this visit:  Myalgia -     Sed Rate (ESR) -  C-reactive protein -     Antinuclear Antib (ANA) -     Rheumatoid Factor  Depression with anxiety  Other orders -     amitriptyline (ELAVIL) 10 MG tablet; Take 1 tablet (10 mg total) by mouth at bedtime.   I am having Ms. Derenzo start on amitriptyline. I am also having her maintain  her albuterol, ALPRAZolam, amLODipine, Vitamin D (Cholecalciferol), and venlafaxine XR.  Meds ordered this encounter  Medications  . amitriptyline (ELAVIL) 10 MG tablet    Sig: Take 1 tablet (10 mg total) by mouth at bedtime.    Dispense:  30 tablet    Refill:  0    Order Specific Question:  Supervising Provider    Answer:  Crecencio Mc [[id number]]     Follow-up: Return in about 4 weeks (around 09/11/2015) for Follow up on medication.

## 2015-08-14 NOTE — Progress Notes (Signed)
Pre visit review using our clinic review tool, if applicable. No additional management support is needed unless otherwise documented below in the visit note. 

## 2015-08-14 NOTE — Patient Instructions (Signed)
Myofascial Pain Syndrome and Fibromyalgia  Myofascial pain syndrome and fibromyalgia are both pain disorders. This pain may be felt mainly in your muscles.   · Myofascial pain syndrome:    Always has trigger points or tender points in the muscle that will cause pain when pressed. The pain may come and go.    Usually affects your neck, upper back, and shoulder areas. The pain often radiates into your arms and hands.  · Fibromyalgia:    Has muscle pains and tenderness that come and go.    Is often associated with fatigue and sleep disturbances.    Has trigger points.    Tends to be long-lasting (chronic), but is not life-threatening.  Fibromyalgia and myofascial pain are not the same. However, they often occur together. If you have both conditions, each can make the other worse. Both are common and can cause enough pain and fatigue to make day-to-day activities difficult.   CAUSES   The exact causes of fibromyalgia and myofascial pain are not known. People with certain gene types may be more likely to develop fibromyalgia. Some factors can be triggers for both conditions, such as:   · Spine disorders.  · Arthritis.  · Severe injury (trauma) and other physical stressors.  · Being under a lot of stress.  · A medical illness.  SIGNS AND SYMPTOMS   Fibromyalgia  The main symptom of fibromyalgia is widespread pain and tenderness in your muscles. This can vary over time. Pain is sometimes described as stabbing, shooting, or burning. You may have tingling or numbness, too. You may also have sleep problems and fatigue. You may wake up feeling tired and groggy (fibro fog). Other symptoms may include:   · Bowel and bladder problems.  · Headaches.  · Visual problems.  · Problems with odors and noises.  · Depression or mood changes.  · Painful menstrual periods (dysmenorrhea).  · Dry skin or eyes.  Myofascial pain syndrome  Symptoms of myofascial pain syndrome include:   · Tight, ropy bands of muscle.    · Uncomfortable  sensations in muscular areas, such as:    Aching.    Cramping.    Burning.    Numbness.    Tingling.      Muscle weakness.  · Trouble moving certain muscles freely (range of motion).  DIAGNOSIS   There are no specific tests to diagnose fibromyalgia or myofascial pain syndrome. Both can be hard to diagnose because their symptoms are common in many other conditions. Your health care provider may suspect one or both of these conditions based on your symptoms and medical history. Your health care provider will also do a physical exam.   The key to diagnosing fibromyalgia is having pain, fatigue, and other symptoms for more than three months that cannot be explained by another condition.   The key to diagnosing myofascial pain syndrome is finding trigger points in muscles that are tender and cause pain elsewhere in your body (referred pain).  TREATMENT   Treating fibromyalgia and myofascial pain often requires a team of health care providers. This usually starts with your primary provider and a physical therapist. You may also find it helpful to work with alternative health care providers, such as massage therapists or acupuncturists.  Treatment for fibromyalgia may include medicines. This may include nonsteroidal anti-inflammatory drugs (NSAIDs), along with other medicines.   Treatment for myofascial pain may also include:  · NSAIDs.  · Cooling and stretching of muscles.  · Trigger point injections.  ·   Sound wave (ultrasound) treatments to stimulate muscles.  HOME CARE INSTRUCTIONS   · Take medicines only as directed by your health care provider.  · Exercise as directed by your health care provider or physical therapist.  · Try to avoid stressful situations.  · Practice relaxation techniques to control your stress. You may want to try:    Biofeedback.    Visual imagery.    Hypnosis.    Muscle relaxation.    Yoga.    Meditation.  · Talk to your health care provider about alternative treatments, such as acupuncture or  massage treatment.  · Maintain a healthy lifestyle. This includes eating a healthy diet and getting enough sleep.  · Consider joining a support group.  · Do not do activities that stress or strain your muscles. That includes repetitive motions and heavy lifting.  SEEK MEDICAL CARE IF:   · You have new symptoms.  · Your symptoms get worse.  · You have side effects from your medicines.  · You have trouble sleeping.  · Your condition is causing depression or anxiety.  FOR MORE INFORMATION   · National Fibromyalgia Association: http://www.fmaware.orgwww.fmaware.org  · Arthritis Foundation: http://www.arthritis.orgwww.arthritis.org  · American Chronic Pain Association: http://www.theacpa.org/condition/myofascial-painwww.theacpa.org/condition/myofascial-pain     This information is not intended to replace advice given to you by your health care provider. Make sure you discuss any questions you have with your health care provider.     Document Released: 08/18/2005 Document Revised: 09/08/2014 Document Reviewed: 05/24/2014  Elsevier Interactive Patient Education ©2016 Elsevier Inc.

## 2015-08-15 LAB — SEDIMENTATION RATE: Sed Rate: 23 mm/hr — ABNORMAL HIGH (ref 0–22)

## 2015-08-15 LAB — ANA: Anti Nuclear Antibody(ANA): NEGATIVE

## 2015-08-21 DIAGNOSIS — M791 Myalgia, unspecified site: Secondary | ICD-10-CM | POA: Insufficient documentation

## 2015-08-21 NOTE — Assessment & Plan Note (Signed)
Patient is stable on Effexor at this time. Will continue. She is very pleased with how making her mood stabilized.

## 2015-08-21 NOTE — Assessment & Plan Note (Addendum)
Diagnosing with Fibromyalgia. She has each of the tender points outlined by the diagnosis criteria by [employer]. Will try amitriptyline 10 mg at night time. Obtain rheum panel to r/out rheumatic concerns. FU in 4 weeks.

## 2015-08-23 ENCOUNTER — Telehealth: Payer: Self-pay | Admitting: Nurse Practitioner

## 2015-08-23 ENCOUNTER — Other Ambulatory Visit: Payer: Self-pay | Admitting: Nurse Practitioner

## 2015-08-23 NOTE — Telephone Encounter (Signed)
Please advise 

## 2015-08-24 NOTE — Telephone Encounter (Signed)
Mychart message sent.

## 2015-09-03 ENCOUNTER — Other Ambulatory Visit: Payer: Self-pay | Admitting: Nurse Practitioner

## 2015-09-11 ENCOUNTER — Ambulatory Visit: Payer: BLUE CROSS/BLUE SHIELD | Admitting: Nurse Practitioner

## 2015-09-14 ENCOUNTER — Ambulatory Visit (INDEPENDENT_AMBULATORY_CARE_PROVIDER_SITE_OTHER): Payer: BLUE CROSS/BLUE SHIELD | Admitting: Nurse Practitioner

## 2015-09-14 ENCOUNTER — Encounter: Payer: Self-pay | Admitting: Nurse Practitioner

## 2015-09-14 VITALS — BP 122/78 | HR 101 | Temp 98.2°F | Resp 16 | Ht 63.5 in | Wt 217.0 lb

## 2015-09-14 DIAGNOSIS — M791 Myalgia, unspecified site: Secondary | ICD-10-CM

## 2015-09-14 NOTE — Patient Instructions (Signed)
Keep up the good work!   See you in 6 months or sooner as needed.

## 2015-09-14 NOTE — Progress Notes (Signed)
Patient ID: Tiffany Jackson, female    DOB: Aug 18, 1981  Age: 35 y.o. MRN: [id number]  CC: Follow-up   HPI Tiffany Jackson presents for follow up of medication management.   1) Tapered every few days  Helped with sleep first few days then nothing  Wants to do all natural  Zumba, stretching, diet Aromatherapy - essential oils    History Tiffany Jackson has a past medical history of Depression; Allergy; Chicken pox; Frequent headaches; Migraines; and Hypertension.   She has past surgical history that includes Cesarean section and Cholecystectomy (N/A, 04/04/2015).   Her family history includes Tiffany Jackson in her paternal grandmother; Cancer in her maternal grandmother and paternal grandfather; Heart disease in her daughter; Hypertension in her father; Multiple sclerosis in her mother.She reports that she quit smoking about 2 years ago. Her smoking use included Cigarettes. She has never used smokeless tobacco. She reports that she does not drink alcohol or use illicit drugs.  Outpatient Prescriptions Prior to Visit  Medication Sig Dispense Refill  . albuterol (PROVENTIL HFA;VENTOLIN HFA) 108 (90 BASE) MCG/ACT inhaler Inhale 2 puffs into the lungs as needed for wheezing or shortness of breath. 1 Inhaler 1  . ALPRAZolam (XANAX) 0.25 MG tablet TAKE 1 TABLET BY MOUTH TWICE DAILY AS NEEDED FOR ANXIETY 60 tablet 0  . amLODipine (NORVASC) 5 MG tablet TAKE 1 TABLET(5 MG) BY MOUTH DAILY 30 tablet 11  . venlafaxine XR (EFFEXOR-XR) 75 MG 24 hr capsule TAKE 1 CAPSULE(75 MG) BY MOUTH DAILY WITH BREAKFAST 30 capsule 1  . Vitamin D, Cholecalciferol, 1000 UNITS TABS Take by mouth daily.    Marland Kitchen amitriptyline (ELAVIL) 10 MG tablet Take 1 tablet (10 mg total) by mouth at bedtime. 30 tablet 0   No facility-administered medications prior to visit.    ROS Review of Systems  Constitutional: Negative for fever, chills, diaphoresis and fatigue.  Musculoskeletal: Positive for myalgias, back pain and neck pain. Negative for joint  swelling, gait problem and neck stiffness.  Psychiatric/Behavioral: Negative for suicidal ideas and sleep disturbance. The patient is not nervous/anxious.     Objective:  BP 122/78 mmHg  Pulse 101  Temp(Src) 98.2 F (36.8 C) (Oral)  Resp 16  Ht 5' 3.5" (1.613 m)  Wt 217 lb (98.431 kg)  BMI 37.83 kg/m2  SpO2 97%  LMP 08/28/2015  Physical Exam  Constitutional: She is oriented to person, place, and time. She appears well-developed and well-nourished. No distress.  HENT:  Head: Normocephalic and atraumatic.  Right Ear: External ear normal.  Left Ear: External ear normal.  Cardiovascular: Normal rate, regular rhythm and normal heart sounds.  Exam reveals no gallop and no friction rub.   No murmur heard. Pulmonary/Chest: Effort normal and breath sounds normal. No respiratory distress. She has no wheezes. She has no rales. She exhibits no tenderness.  Musculoskeletal: Normal range of motion. She exhibits tenderness. She exhibits no edema.  Tender at points around neck, chest, down back (cervical, thoracic, lumbar, sacral) ect... bilaterally  Neurological: She is alert and oriented to person, place, and time. No cranial nerve deficit. She exhibits normal muscle tone. Coordination normal.  Skin: Skin is warm and dry. No rash noted. She is not diaphoretic.  Psychiatric: She has a normal mood and affect. Her behavior is normal. Judgment and thought content normal.      Assessment & Plan:   Amana was seen today for follow-up.  Diagnoses and all orders for this visit:  Myalgia   I have discontinued Ms. Skoda's amitriptyline. I am  also having her maintain her albuterol, amLODipine, Vitamin D (Cholecalciferol), ALPRAZolam, and venlafaxine XR.  No orders of the defined types were placed in this encounter.     Follow-up: Return in about 6 months (around 03/13/2016) for Follow up .

## 2015-09-16 NOTE — Assessment & Plan Note (Signed)
Pt did not respond to Elavil.  Pt wants to pursue natural therapies like yoga, massage, oils, ect... FU prn worsening/failure to improve.

## 2015-10-05 ENCOUNTER — Ambulatory Visit (INDEPENDENT_AMBULATORY_CARE_PROVIDER_SITE_OTHER): Payer: BLUE CROSS/BLUE SHIELD | Admitting: Family Medicine

## 2015-10-05 ENCOUNTER — Encounter: Payer: Self-pay | Admitting: Family Medicine

## 2015-10-05 VITALS — BP 142/92 | HR 123 | Temp 97.9°F | Ht 63.5 in | Wt 215.8 lb

## 2015-10-05 DIAGNOSIS — R0789 Other chest pain: Secondary | ICD-10-CM | POA: Diagnosis not present

## 2015-10-05 DIAGNOSIS — R079 Chest pain, unspecified: Secondary | ICD-10-CM | POA: Insufficient documentation

## 2015-10-05 NOTE — Assessment & Plan Note (Signed)
New problem (to me). I reviewed the prior cardiology note. In summary, chest pain was atypical and unlikely to be cardiac in nature. EKG and echo reassuring. EKG obtained today and was reviewed independently. See note. Nonischemic. Exam consistent with MSK pain (worsened by anxiety). Reassurance provided. Advise continue use of Xanax and PRN Tylenol for pain.

## 2015-10-05 NOTE — Patient Instructions (Signed)
This is not consistent with cardiac etiology.  If it persists, you can return to cardiology.  Take tylenol for your discomfort.  Xanax as needed.   Take care  Dr. Adriana Simas

## 2015-10-05 NOTE — Progress Notes (Signed)
Pre visit review using our clinic review tool, if applicable. No additional management support is needed unless otherwise documented below in the visit note. 

## 2015-10-05 NOTE — Progress Notes (Signed)
Subjective:  Patient ID: Tiffany Jackson, female    DOB: [birth year]  Age: 35 y.o. MRN: [id number]  CC: Chest pain  HPI:  35 year old female with past medical history of hypertension, and depression/anxiety presents with complaints of chest pain.  Chest pain  Patient reports that she developed chest pain yesterday.  Pain has persisted into today.  Pain is left-sided with radiation to the axilla and down the arm.  Denies any associated shortness of breath. No diaphoresis. She does report nausea.  She appears visibly anxious.  Pain is currently 2/10 in severity. No known relieving factors.   Social Hx   Social History   Social History  . Marital Status: Married    Spouse Name: N/A  . Number of Children: N/A  . Years of Education: N/A   Social History Main Topics  . Smoking status: Former Smoker    Types: Cigarettes    Quit date: 07/02/2013  . Smokeless tobacco: Never Used  . Alcohol Use: No  . Drug Use: No  . Sexual Activity:    Partners: Male    Birth Control/ Protection: Surgical     Comment: Husband   Other Topics Concern  . 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  Respiratory: Negative for shortness of breath.   Cardiovascular: Positive for chest pain.  Gastrointestinal: Positive for nausea.   Objective:  BP 142/92 mmHg  Pulse 123  Temp(Src) 97.9 F (36.6 C) (Oral)  Ht 5' 3.5" (1.613 m)  Wt 215 lb 12 oz (97.864 kg)  BMI 37.61 kg/m2  SpO2 99%  LMP 08/28/2015  BP/Weight 10/05/2015 09/14/2015 08/14/2015  Systolic BP 142 122 120  Diastolic BP 92 78 80  Wt. (Lbs) 215.75 217 215  BMI 37.61 37.83 37.48   Physical Exam  Constitutional: She is oriented to person, place, and time. She appears well-developed. No distress.  Cardiovascular: Regular rhythm.   Tachycardia present.   Pulmonary/Chest: Effort normal and breath sounds normal. No respiratory distress. She has no wheezes. She has no rales.  Left-sided chest wall tender to palpation.  Neurological: She is alert and oriented to person, place, and time.  Psychiatric:  Very anxious.  Vitals reviewed.  Lab Results  Component Value Date   WBC 7.5 04/29/2015   HGB 14.2 04/29/2015   HCT 42.5 04/29/2015   PLT 261 04/29/2015   GLUCOSE 106* 04/29/2015   CHOL 165 05/15/2015   TRIG 107.0 05/15/2015   HDL 33.70* 05/15/2015   LDLCALC 110* 05/15/2015   ALT 34 04/04/2015   AST 23 04/04/2015   NA 138 04/29/2015   K 3.6 04/29/2015   CL 105 04/29/2015   CREATININE 0.80 04/29/2015   BUN 9 04/29/2015   CO2 25 04/29/2015   TSH 0.77 05/15/2015   ED ECG REPORT   Date: 10/05/2015  EKG Time: 10:44 AM  Rate: 103  Rhythm: Sinus tachycardia  Axis: normal axis.  Intervals: normal.   ST&T Change: Mild ST depression in the lateral leads as well as lead 2 (1 small box or less).   Narrative Interpretation: Sinus tachycardia at a rate of 103. Normal intervals. Mild ST depression in the lateral leads as well as lead 2.  Assessment & Plan:   Problem List  Items Addressed This Visit    Chest pain - Primary    New problem (to me). I reviewed the prior cardiology note. In summary, chest pain was atypical and unlikely to be cardiac in nature. EKG and echo reassuring. EKG obtained today and was reviewed independently. See note. Nonischemic. Exam consistent with MSK pain (worsened by anxiety). Reassurance provided. Advise continue use of Xanax and PRN Tylenol for pain.       Relevant Orders   EKG 12-Lead (Completed)      Follow-up: PRN  Everlene Other DO [facility]

## 2015-10-22 ENCOUNTER — Other Ambulatory Visit: Payer: Self-pay | Admitting: Nurse Practitioner

## 2015-10-25 ENCOUNTER — Ambulatory Visit (INDEPENDENT_AMBULATORY_CARE_PROVIDER_SITE_OTHER): Payer: BLUE CROSS/BLUE SHIELD | Admitting: Nurse Practitioner

## 2015-10-25 ENCOUNTER — Encounter: Payer: Self-pay | Admitting: Nurse Practitioner

## 2015-10-25 ENCOUNTER — Telehealth: Payer: Self-pay

## 2015-10-25 VITALS — BP 110/80 | HR 88 | Temp 98.1°F | Ht 63.5 in | Wt 219.8 lb

## 2015-10-25 DIAGNOSIS — R6889 Other general symptoms and signs: Secondary | ICD-10-CM

## 2015-10-25 LAB — POCT INFLUENZA A/B
INFLUENZA A, POC: NEGATIVE
Influenza B, POC: NEGATIVE

## 2015-10-25 MED ORDER — HYDROCOD POLST-CPM POLST ER 10-8 MG/5ML PO SUER: 5.0000 mL | Freq: Every evening | ORAL | Status: DC | PRN

## 2015-10-25 MED ORDER — AMOXICILLIN-POT CLAVULANATE 875-125 MG PO TABS: 1.0000 | ORAL_TABLET | Freq: Two times a day (BID) | ORAL | Status: DC

## 2015-10-25 NOTE — Telephone Encounter (Signed)
Does not sound like flu, but will see her at 1:45. Thanks!

## 2015-10-25 NOTE — Telephone Encounter (Signed)
Reason for call: poss flu Symptoms: nasal congestion, sneezing,  Headache, runny nose- yellow mucus, sore throat, cough - yellow mucus, fever- 99.5 this morning, facial pressure Duration: started 4 days ago Medications: liquid cold and flu - Monday thru yesterday with no relief, theraflu Last seen for this problem: no Seen by: no one   Pt put on your schedule for 1:45pm. Please advise,thanks

## 2015-10-25 NOTE — Patient Instructions (Signed)
Augmentin as prescribed.   5 mL (1 teaspoon) of cough syrup. Don't drive or make important decisions while taking this....just sleep and rest.

## 2015-10-25 NOTE — Progress Notes (Signed)
Pre visit review using our clinic review tool, if applicable. No additional management support is needed unless otherwise documented below in the visit note. 

## 2015-10-25 NOTE — Progress Notes (Signed)
Patient ID: Tiffany Jackson, female    DOB: 1981/07/05  Age: 35 y.o. MRN: [id number]  CC: Acute Visit and Nasal Congestion   HPI Tiffany Jackson presents for CC of Sinus pressure x 1 wk.  1) Possible sinus infection, facial pain, theraflu Tmax 100.2 F  Sick contacts- people at the beach  Throat very sore, cough   Patient was recently seen by Dr. Adriana Simas on 10/05/2015 for chest pain EKG was normal and discussed it was probably MSK pain worsened by anxiety  History Tiffany Jackson has a past medical history of Depression; Allergy; Chicken pox; Frequent headaches; Migraines; and Hypertension.   She has past surgical history that includes Cesarean section and Cholecystectomy (N/A, 04/04/2015).   Her family history includes Anuerysm in her paternal grandmother; Cancer in her maternal grandmother and paternal grandfather; Heart disease in her daughter; Hypertension in her father; Multiple sclerosis in her mother.She reports that she quit smoking about 2 years ago. Her smoking use included Cigarettes. She has never used smokeless tobacco. She reports that she does not drink alcohol or use illicit drugs.  Outpatient Prescriptions Prior to Visit  Medication Sig Dispense Refill  . albuterol (PROVENTIL HFA;VENTOLIN HFA) 108 (90 BASE) MCG/ACT inhaler Inhale 2 puffs into the lungs as needed for wheezing or shortness of breath. 1 Inhaler 1  . ALPRAZolam (XANAX) 0.25 MG tablet TAKE 1 TABLET BY MOUTH TWICE DAILY AS NEEDED FOR ANXIETY 60 tablet 0  . amLODipine (NORVASC) 5 MG tablet TAKE 1 TABLET(5 MG) BY MOUTH DAILY 30 tablet 11  . venlafaxine XR (EFFEXOR-XR) 75 MG 24 hr capsule TAKE 1 CAPSULE(75 MG) BY MOUTH DAILY WITH BREAKFAST 30 capsule 11  . Vitamin D, Cholecalciferol, 1000 UNITS TABS Take by mouth daily.     No facility-administered medications prior to visit.    ROS Review of Systems  Constitutional: Positive for fatigue. Negative for fever, chills and diaphoresis.  HENT: Positive for congestion and sore  throat.   Respiratory: Positive for cough. Negative for chest tightness, shortness of breath and wheezing.   Cardiovascular: Negative for chest pain, palpitations and leg swelling.  Gastrointestinal: Negative for nausea, vomiting and diarrhea.  Skin: Negative for rash.  Neurological: Negative for dizziness and headaches.    Objective:  BP 110/80 mmHg  Pulse 88  Temp(Src) 98.1 F (36.7 C) (Oral)  Ht 5' 3.5" (1.613 m)  Wt 219 lb 12 oz (99.678 kg)  BMI 38.31 kg/m2  SpO2 97%  LMP 10/04/2015  Physical Exam  Constitutional: She is oriented to person, place, and time. She appears well-developed and well-nourished. No distress.  HENT:  Head: Normocephalic and atraumatic.  Right Ear: External ear normal.  Left Ear: External ear normal.  Cardiovascular: Normal rate, regular rhythm and normal heart sounds.  Exam reveals no gallop and no friction rub.   No murmur heard. Pulmonary/Chest: Effort normal and breath sounds normal. No respiratory distress. She has no wheezes. She has no rales. She exhibits no tenderness.  Neurological: She is alert and oriented to person, place, and time. No cranial nerve deficit. She exhibits normal muscle tone. Coordination normal.  Skin: Skin is warm and dry. No rash noted. She is not diaphoretic.  Psychiatric: She has a normal mood and affect. Her behavior is normal. Judgment and thought content normal.   Assessment & Plan:   Tiffany Jackson was seen today for acute visit and nasal congestion.  Diagnoses and all orders for this visit:  Flu-like symptoms -     POCT Influenza A/B  Other orders -  amoxicillin-clavulanate (AUGMENTIN) 875-125 MG tablet; Take 1 tablet by mouth 2 (two) times daily. -     chlorpheniramine-HYDROcodone (TUSSIONEX PENNKINETIC ER) 10-8 MG/5ML SUER; Take 5 mLs by mouth at bedtime as needed for cough.   I am having Tiffany Jackson start on amoxicillin-clavulanate and chlorpheniramine-HYDROcodone. I am also having her maintain her albuterol,  amLODipine, Vitamin D (Cholecalciferol), ALPRAZolam, and venlafaxine XR.  Meds ordered this encounter  Medications  . amoxicillin-clavulanate (AUGMENTIN) 875-125 MG tablet    Sig: Take 1 tablet by mouth 2 (two) times daily.    Dispense:  14 tablet    Refill:  0    Order Specific Question:  Supervising Provider    Answer:  Duncan Dull L [[id number]]  . chlorpheniramine-HYDROcodone (TUSSIONEX PENNKINETIC ER) 10-8 MG/5ML SUER    Sig: Take 5 mLs by mouth at bedtime as needed for cough.    Dispense:  115 mL    Refill:  0    Order Specific Question:  Supervising Provider    Answer:  Sherlene Shams [[id number]]     Follow-up: Return if symptoms worsen or fail to improve.

## 2015-11-02 DIAGNOSIS — R6889 Other general symptoms and signs: Secondary | ICD-10-CM | POA: Insufficient documentation

## 2015-11-02 NOTE — Assessment & Plan Note (Signed)
New onset Augmentin, Tussionex prescribed for patient Tussionex was printed and given to patient and cautioned about dizzy effects at this time. Follow-up as needed

## 2016-03-13 ENCOUNTER — Ambulatory Visit: Payer: BLUE CROSS/BLUE SHIELD | Admitting: Nurse Practitioner

## 2016-03-26 ENCOUNTER — Ambulatory Visit: Payer: BLUE CROSS/BLUE SHIELD | Admitting: Nurse Practitioner

## 2016-03-26 ENCOUNTER — Ambulatory Visit: Payer: BLUE CROSS/BLUE SHIELD | Admitting: Family Medicine

## 2016-04-01 ENCOUNTER — Encounter: Payer: Self-pay | Admitting: Family Medicine

## 2016-04-01 ENCOUNTER — Ambulatory Visit (INDEPENDENT_AMBULATORY_CARE_PROVIDER_SITE_OTHER): Payer: BLUE CROSS/BLUE SHIELD | Admitting: Family Medicine

## 2016-04-01 DIAGNOSIS — F418 Other specified anxiety disorders: Secondary | ICD-10-CM

## 2016-04-01 DIAGNOSIS — M797 Fibromyalgia: Secondary | ICD-10-CM | POA: Diagnosis not present

## 2016-04-01 DIAGNOSIS — I10 Essential (primary) hypertension: Secondary | ICD-10-CM | POA: Diagnosis not present

## 2016-04-01 MED ORDER — PREGABALIN 50 MG PO CAPS: 50.0000 mg | ORAL_CAPSULE | Freq: Every day | ORAL | 0 refills | Status: DC

## 2016-04-01 NOTE — Patient Instructions (Signed)
Try the lyrica.  Follow up in 6 months.  Call or send my chart with concerns.  Take care  Dr. Adriana Simas

## 2016-04-01 NOTE — Assessment & Plan Note (Signed)
New problem. Meets diagnostic criteria. Discussed today. Empiric trial of Lyrica.

## 2016-04-01 NOTE — Progress Notes (Signed)
Subjective:  Patient ID: [name], female    DOB: 1980-11-07  Age: 35 y.o. MRN: [id number]  CC: Follow up  HPI:  35 year old female with HTN, and Depression/Anxiety presents for follow up. She would like to discuss her pain as well. She is concerned about fibromyalgia.  HTN  Stable.  Doing well on Norvasc.  Depression/Anxiety  Doing well at this time on Effexor 75 mg daily.  Myalgia/MSK pain, ? Fibromyalgia  Patient reports chronic diffuse pain.  She's particular troubled by chest pain or leg pain.  She has discussed this with her prior provider. A diagnosis was not made at that time.  She has tried amitriptyline without improvement.  Patient states that her pain is intermittent and and at times very severe.  This is often triggered by activity.  She is trying to exercise regularly but this is difficult given where she lives.  She would like to discuss this today.  Social Hx   Social History   Social History  . Marital status: Married    Spouse name: N/A  . Number of children: N/A  . Years of education: N/A   Social History Main Topics  . Smoking status: Former Smoker    Types: Cigarettes    Quit date: 07/02/2013  . Smokeless tobacco: Never Used  . Alcohol use No  . Drug use: No  . Sexual activity: Yes    Partners: Male    Birth control/ protection: Surgical     Comment: Husband   Other Topics Concern  . 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   Musculoskeletal: Positive for arthralgias and myalgias.   Objective:  BP 126/86 (BP Location: Left Arm, Patient Position: Sitting, Cuff Size: Large)   Pulse 83   Temp 97.8 F (36.6 C) (Oral)   Wt 228 lb 6 oz (103.6 kg)   SpO2 98%   BMI 39.82 kg/m   BP/Weight 04/01/2016 10/25/2015 10/05/2015  Systolic BP 126 110  142  Diastolic BP 86 80 92  Wt. (Lbs) 228.38 219.75 215.75  BMI 39.82 38.31 37.61   Physical Exam  Constitutional: She is oriented to person, place, and time. She appears well-developed. No distress.  Cardiovascular: Normal rate and regular rhythm.   Pulmonary/Chest: Effort normal. She has no wheezes. She has no rales.  Musculoskeletal:  Patient with numerous tenderpoints (Fibromyalgia tender point map) with the worse being those on the chest.  Neurological: She is alert and oriented to person, place, and time.  Psychiatric: She has a normal mood and affect.  Vitals reviewed.  Lab Results  Component Value Date   WBC 7.5 04/29/2015   HGB 14.2 04/29/2015   HCT 42.5 04/29/2015   PLT 261 04/29/2015   GLUCOSE 106 (H) 04/29/2015   CHOL 165 05/15/2015   TRIG 107.0 05/15/2015   HDL 33.70 (L) 05/15/2015   LDLCALC 110 (H) 05/15/2015   ALT 34 04/04/2015   AST 23 04/04/2015   NA 138 04/29/2015   K 3.6 04/29/2015   CL 105 04/29/2015   CREATININE 0.80 04/29/2015   BUN 9 04/29/2015   CO2 25 04/29/2015   TSH 0.77 05/15/2015   Assessment & Plan:   Problem List Items Addressed This Visit    Depression with  anxiety    Stable. Continue Effexor.      Benign essential HTN    Stable. Continue Norvasc.      Fibromyalgia    New problem. Meets diagnostic criteria. Discussed today. Empiric trial of Lyrica.       Other Visit Diagnoses   None.    Meds ordered this encounter  Medications  . pregabalin (LYRICA) 50 MG capsule    Sig: Take 1 capsule (50 mg total) by mouth at bedtime.    Dispense:  30 capsule    Refill:  0   Follow-up: PRN  Everlene Other DO [facility]

## 2016-04-01 NOTE — Progress Notes (Signed)
Pre visit review using our clinic review tool, if applicable. No additional management support is needed unless otherwise documented below in the visit note. 

## 2016-04-01 NOTE — Assessment & Plan Note (Signed)
Stable.  Continue Effexor. 

## 2016-04-01 NOTE — Assessment & Plan Note (Signed)
Stable. Continue Norvasc.  

## 2016-06-15 ENCOUNTER — Other Ambulatory Visit: Payer: Self-pay | Admitting: Nurse Practitioner

## 2016-09-23 ENCOUNTER — Telehealth: Payer: Self-pay | Admitting: *Deleted

## 2016-09-23 NOTE — Telephone Encounter (Signed)
Yes

## 2016-09-23 NOTE — Telephone Encounter (Signed)
okay

## 2016-09-23 NOTE — Telephone Encounter (Signed)
Pt currently Is established with Dr. Adriana Simasook, however prefers a female. Pt requested to see margaret Arnett to continue care. Will this switch be okay?

## 2016-10-09 ENCOUNTER — Ambulatory Visit (INDEPENDENT_AMBULATORY_CARE_PROVIDER_SITE_OTHER): Payer: BLUE CROSS/BLUE SHIELD | Admitting: Family

## 2016-10-09 ENCOUNTER — Telehealth: Payer: Self-pay | Admitting: Radiology

## 2016-10-09 ENCOUNTER — Encounter: Payer: Self-pay | Admitting: Family

## 2016-10-09 VITALS — BP 136/88 | HR 89 | Temp 97.9°F | Ht 63.5 in | Wt 238.2 lb

## 2016-10-09 DIAGNOSIS — G43809 Other migraine, not intractable, without status migrainosus: Secondary | ICD-10-CM

## 2016-10-09 DIAGNOSIS — F418 Other specified anxiety disorders: Secondary | ICD-10-CM | POA: Diagnosis not present

## 2016-10-09 DIAGNOSIS — M797 Fibromyalgia: Secondary | ICD-10-CM

## 2016-10-09 DIAGNOSIS — K649 Unspecified hemorrhoids: Secondary | ICD-10-CM | POA: Diagnosis not present

## 2016-10-09 DIAGNOSIS — Z0000 Encounter for general adult medical examination without abnormal findings: Secondary | ICD-10-CM

## 2016-10-09 DIAGNOSIS — I10 Essential (primary) hypertension: Secondary | ICD-10-CM | POA: Diagnosis not present

## 2016-10-09 MED ORDER — VENLAFAXINE HCL ER 75 MG PO CP24: ORAL_CAPSULE | ORAL | 3 refills | Status: DC

## 2016-10-09 MED ORDER — AMLODIPINE BESYLATE 5 MG PO TABS: ORAL_TABLET | ORAL | 3 refills | Status: DC

## 2016-10-09 MED ORDER — HYDROCORTISONE 2.5 % RE CREA: 1.0000 "application " | TOPICAL_CREAM | Freq: Two times a day (BID) | RECTAL | 1 refills | Status: DC

## 2016-10-09 NOTE — Telephone Encounter (Signed)
Unable to get blood work on pt, she is coming back for redraw on next appt. Will reorder labs for future.

## 2016-10-09 NOTE — Addendum Note (Signed)
Addended by: Penne LashWIGGINS, Liylah Najarro N on: 10/09/2016 11:16 AM   Modules accepted: Orders

## 2016-10-09 NOTE — Assessment & Plan Note (Signed)
Empirically treat today; will let me know if not better

## 2016-10-09 NOTE — Assessment & Plan Note (Signed)
Stable on current regimen. Will follow 

## 2016-10-09 NOTE — Assessment & Plan Note (Signed)
No early family history of colon cancer or breast cancer. CBE performed. She is on menstrual cycle ; due for her Pap and she will come back for this. Screening labs today. Advised patient had tetanus on the local pharmacy due to cost. Encouraged patient to start exercise program.

## 2016-10-09 NOTE — Progress Notes (Signed)
Pre visit review using our clinic review tool, if applicable. No additional management support is needed unless otherwise documented below in the visit note. 

## 2016-10-09 NOTE — Assessment & Plan Note (Signed)
Stable. Controlled on current regimen.  

## 2016-10-09 NOTE — Assessment & Plan Note (Addendum)
Stable. Pleased to see the patient is doing well on current regimen. Patient using Xanax very sparingly. I looked up patient on [place] Controlled Substances Reporting System and saw no activity that raised concern of inappropriate use.  Discussed risks of BZDs and patient agreed with long term plan of decreasing/discontinuing.

## 2016-10-09 NOTE — Progress Notes (Signed)
Subjective:    Patient ID: Tiffany Jackson, female    DOB: 08/18/1981, 36 y.o.   MRN: [id number]  CC: Tiffany FossaMelissa Bandel is a 36 y.o. female who presents today for physical exam.    HPI: Migraines - once per week; had them since 36 yo; HA 'similar to ones in the past.' Normally on the right side. No known triggers. Resolves with sleep, dark room. No vision changes, aura, numbness or tingling of face.   Depression and anxiety- using xanax and effexor; stable. Has been on 2 years/  Anxiety much better. H/o panic attacks. Has taken xanax 'in months.' No thoughts of hurting herself or anyone else.    HTN- compliant with medication. Denies exertional chest pain or pressure, numbness or tingling radiating to left arm or jaw, palpitations, dizziness, frequent headaches, changes in vision, or shortness of breath.   Fibromyalgia- stable. Controlled with vitamins and occasional ibuprofen. lyrica didn't help.   Has chronic hemorrhoids since having children; would like prescription medication        Colorectal Cancer Screening: No early family history. Breast Cancer Screening: No early family history.  Cervical Cancer Screening: No enough cells for pap 2016. Due; on menstrual period, will come back Bone Health screening/DEXA for 65+: No increased fracture risk. Defer screening at this time. Lung Cancer Screening: Doesn't have 30 year pack year history and age > 55 years.       Tetanus - Due        Labs: Screening labs today. Exercise: planning to start exercise.  Alcohol use: none Smoking/tobacco use: former smoker.  Regular dental exams: In need of dental exam. Wears seat belt: Yes.  HISTORY:  Past Medical History:  Diagnosis Date  . Allergy    Seasonal  . Chicken pox   . Depression   . Frequent headaches   . Hypertension   . Migraines     Past Surgical History:  Procedure Laterality Date  . CESAREAN SECTION     2003, 2005, 2009  . CHOLECYSTECTOMY N/A 04/04/2015   Procedure:  LAPAROSCOPIC CHOLECYSTECTOMY;  Surgeon: Natale LayMark Bird, MD;  Location: [facility] ORS;  Service: General;  Laterality: N/A;   Family History  Problem Relation Age of Onset  . Multiple sclerosis Mother   . Hypertension Father   . Cancer Maternal Grandmother 50    Breast Cancer  . Cancer Paternal Grandfather     Lung Cancer  . Anuerysm Paternal Grandmother   . Heart disease Daughter       ALLERGIES: Patient has no known allergies.  Current Outpatient Prescriptions on File Prior to Visit  Medication Sig Dispense Refill  . albuterol (PROVENTIL HFA;VENTOLIN HFA) 108 (90 BASE) MCG/ACT inhaler Inhale 2 puffs into the lungs as needed for wheezing or shortness of breath. 1 Inhaler 1  . ALPRAZolam (XANAX) 0.25 MG tablet TAKE 1 TABLET BY MOUTH TWICE DAILY AS NEEDED FOR ANXIETY 60 tablet 0  . Vitamin D, Cholecalciferol, 1000 UNITS TABS Take by mouth daily.     No current facility-administered medications on file prior to visit.     Social History  Substance Use Topics  . Smoking status: Former Smoker    Types: Cigarettes    Quit date: 07/02/2013  . Smokeless tobacco: Never Used  . Alcohol use No    Review of Systems  Constitutional: Negative for chills and fever.  Respiratory: Negative for cough.   Cardiovascular: Negative for chest pain and palpitations.  Gastrointestinal: Negative for abdominal distention, abdominal pain, nausea and vomiting.  Genitourinary: Negative for pelvic pain.  Psychiatric/Behavioral: Negative for sleep disturbance and suicidal ideas. The patient is nervous/anxious.       Objective:    BP 136/88   Pulse 89   Temp 97.9 F (36.6 C) (Oral)   Ht 5' 3.5" (1.613 m)   Wt 238 lb 3.2 oz (108 kg)   LMP 10/08/2016 (Exact Date)   SpO2 98%   BMI 41.53 kg/m   BP Readings from Last 3 Encounters:  10/09/16 136/88  04/01/16 126/86  10/25/15 110/80   Wt Readings from Last 3 Encounters:  10/09/16 238 lb 3.2 oz (108 kg)  04/01/16 228 lb 6 oz (103.6 kg)  10/25/15 219 lb  12 oz (99.7 kg)    Physical Exam  Constitutional: She appears well-developed and well-nourished.  Eyes: Conjunctivae are normal.  Neck: No thyroid mass and no thyromegaly present.  Cardiovascular: Normal rate, regular rhythm, normal heart sounds and normal pulses.   Pulmonary/Chest: Effort normal and breath sounds normal. She has no wheezes. She has no rhonchi. She has no rales. Right breast exhibits no inverted nipple, no mass, no nipple discharge, no skin change and no tenderness. Left breast exhibits no inverted nipple, no mass, no nipple discharge, no skin change and no tenderness. Breasts are symmetrical.  CBE performed.   Lymphadenopathy:       Head (right side): No submental, no submandibular, no tonsillar, no preauricular, no posterior auricular and no occipital adenopathy present.       Head (left side): No submental, no submandibular, no tonsillar, no preauricular, no posterior auricular and no occipital adenopathy present.    She has no cervical adenopathy.       Right cervical: No superficial cervical, no deep cervical and no posterior cervical adenopathy present.      Left cervical: No superficial cervical, no deep cervical and no posterior cervical adenopathy present.    She has no axillary adenopathy.  Neurological: She is alert.  Skin: Skin is warm and dry.  Psychiatric: She has a normal mood and affect. Her speech is normal and behavior is normal. Thought content normal.  Vitals reviewed.      Assessment & Plan:   Problem List Items Addressed This Visit      Cardiovascular and Mediastinum   Migraines    Stable. Responds to sleep. Headache similar to ones in past.      Relevant Medications   venlafaxine XR (EFFEXOR-XR) 75 MG 24 hr capsule   amLODipine (NORVASC) 5 MG tablet   Benign essential HTN    Stable. Controlled on current regimen.      Relevant Medications   amLODipine (NORVASC) 5 MG tablet   Hemorrhoids    Empirically treat today; will let me know if  not better      Relevant Medications   amLODipine (NORVASC) 5 MG tablet   hydrocortisone (ANUSOL-HC) 2.5 % rectal cream     Other   Depression with anxiety    Stable. Pleased to see the patient is doing well on current regimen. Patient using Xanax very sparingly.Discussed risks of BZDs and patient agreed with long term plan of decreasing/discontinuing.       Relevant Medications   venlafaxine XR (EFFEXOR-XR) 75 MG 24 hr capsule   Routine general medical examination at a health care facility - Primary    No early family history of colon cancer or breast cancer. CBE performed. She is on menstrual cycle ; due for her Pap and she will come back for this.  Screening labs today. Advised patient had tetanus on the local pharmacy due to cost. Encouraged patient to start exercise program.      Relevant Orders   CBC with Differential/Platelet   Comprehensive metabolic panel   Hemoglobin A1c   Lipid panel   TSH   VITAMIN D 25 Hydroxy (Vit-D Deficiency, Fractures)   Fibromyalgia    Stable on current regimen. Will follow.           I have discontinued Ms. Muston's pregabalin. I am also having her start on hydrocortisone. Additionally, I am having her maintain her albuterol, Vitamin D (Cholecalciferol), ALPRAZolam, venlafaxine XR, and amLODipine.   Meds ordered this encounter  Medications  . venlafaxine XR (EFFEXOR-XR) 75 MG 24 hr capsule    Sig: TAKE 1 CAPSULE(75 MG) BY MOUTH DAILY WITH BREAKFAST    Dispense:  90 capsule    Refill:  3    Order Specific Question:   Supervising Provider    Answer:   Duncan Dull L [[id number]]  . amLODipine (NORVASC) 5 MG tablet    Sig: TAKE 1 TABLET(5 MG) BY MOUTH DAILY    Dispense:  90 tablet    Refill:  3    Order Specific Question:   Supervising Provider    Answer:   Duncan Dull L [[id number]]  . hydrocortisone (ANUSOL-HC) 2.5 % rectal cream    Sig: Place 1 application rectally 2 (two) times daily.    Dispense:  30 g    Refill:  1    Order Specific  Question:   Supervising Provider    Answer:   Sherlene Shams [[id number]]    Return precautions given.   Risks, benefits, and alternatives of the medications and treatment plan prescribed today were discussed, and patient expressed understanding.   Education regarding symptom management and diagnosis given to patient on AVS.   Continue to follow with Rennie Plowman, FNP for routine health maintenance.   Tiffany Jackson and I agreed with plan.   Rennie Plowman, FNP

## 2016-10-09 NOTE — Assessment & Plan Note (Signed)
Stable. Responds to sleep. Headache similar to ones in past.

## 2016-10-09 NOTE — Patient Instructions (Signed)
Please return for Pap smear. Let me know if hemorrhoids not improve on cream.  Pleasure meeting you  Health Maintenance, Female Introduction Adopting a healthy lifestyle and getting preventive care can go a long way to promote health and wellness. Talk with your health care provider about what schedule of regular examinations is right for you. This is a good chance for you to check in with your provider about disease prevention and staying healthy. In between checkups, there are plenty of things you can do on your own. Experts have done a lot of research about which lifestyle changes and preventive measures are most likely to keep you healthy. Ask your health care provider for more information. Weight and diet Eat a healthy diet  Be sure to include plenty of vegetables, fruits, low-fat dairy products, and lean protein.  Do not eat a lot of foods high in solid fats, added sugars, or salt.  Get regular exercise. This is one of the most important things you can do for your health.  Most adults should exercise for at least 150 minutes each week. The exercise should increase your heart rate and make you sweat (moderate-intensity exercise).  Most adults should also do strengthening exercises at least twice a week. This is in addition to the moderate-intensity exercise. Maintain a healthy weight  Body mass index (BMI) is a measurement that can be used to identify possible weight problems. It estimates body fat based on height and weight. Your health care provider can help determine your BMI and help you achieve or maintain a healthy weight.  For females 35 years of age and older:  A BMI below 18.5 is considered underweight.  A BMI of 18.5 to 24.9 is normal.  A BMI of 25 to 29.9 is considered overweight.  A BMI of 30 and above is considered obese. Watch levels of cholesterol and blood lipids  You should start having your blood tested for lipids and cholesterol at 35 years of age, then  have this test every 5 years.  You may need to have your cholesterol levels checked more often if:  Your lipid or cholesterol levels are high.  You are older than 35 years of age.  You are at high risk for heart disease. Cancer screening Lung Cancer  Lung cancer screening is recommended for adults 53-58 years old who are at high risk for lung cancer because of a history of smoking.  A yearly low-dose CT scan of the lungs is recommended for people who:  Currently smoke.  Have quit within the past 15 years.  Have at least a 30-pack-year history of smoking. A pack year is smoking an average of one pack of cigarettes a day for 1 year.  Yearly screening should continue until it has been 15 years since you quit.  Yearly screening should stop if you develop a health problem that would prevent you from having lung cancer treatment. Breast Cancer  Practice breast self-awareness. This means understanding how your breasts normally appear and feel.  It also means doing regular breast self-exams. Let your health care provider know about any changes, no matter how small.  If you are in your 20s or 30s, you should have a clinical breast exam (CBE) by a health care provider every 1-3 years as part of a regular health exam.  If you are 87 or older, have a CBE every year. Also consider having a breast X-ray (mammogram) every year.  If you have a family history of breast cancer,  talk to your health care provider about genetic screening.  If you are at high risk for breast cancer, talk to your health care provider about having an MRI and a mammogram every year.  Breast cancer gene (BRCA) assessment is recommended for women who have family members with BRCA-related cancers. BRCA-related cancers include:  Breast.  Ovarian.  Tubal.  Peritoneal cancers.  Results of the assessment will determine the need for genetic counseling and BRCA1 and BRCA2 testing. Cervical Cancer  Your health care  provider may recommend that you be screened regularly for cancer of the pelvic organs (ovaries, uterus, and vagina). This screening involves a pelvic examination, including checking for microscopic changes to the surface of your cervix (Pap test). You may be encouraged to have this screening done every 3 years, beginning at age 73.  For women ages 22-65, health care providers may recommend pelvic exams and Pap testing every 3 years, or they may recommend the Pap and pelvic exam, combined with testing for human papilloma virus (HPV), every 5 years. Some types of HPV increase your risk of cervical cancer. Testing for HPV may also be done on women of any age with unclear Pap test results.  Other health care providers may not recommend any screening for nonpregnant women who are considered low risk for pelvic cancer and who do not have symptoms. Ask your health care provider if a screening pelvic exam is right for you.  If you have had past treatment for cervical cancer or a condition that could lead to cancer, you need Pap tests and screening for cancer for at least 20 years after your treatment. If Pap tests have been discontinued, your risk factors (such as having a new sexual partner) need to be reassessed to determine if screening should resume. Some women have medical problems that increase the chance of getting cervical cancer. In these cases, your health care provider may recommend more frequent screening and Pap tests. Colorectal Cancer  This type of cancer can be detected and often prevented.  Routine colorectal cancer screening usually begins at 35 years of age and continues through 35 years of age.  Your health care provider may recommend screening at an earlier age if you have risk factors for colon cancer.  Your health care provider may also recommend using home test kits to check for hidden blood in the stool.  A small camera at the end of a tube can be used to examine your colon directly  (sigmoidoscopy or colonoscopy). This is done to check for the earliest forms of colorectal cancer.  Routine screening usually begins at age 88.  Direct examination of the colon should be repeated every 5-10 years through 35 years of age. However, you may need to be screened more often if early forms of precancerous polyps or small growths are found. Skin Cancer  Check your skin from head to toe regularly.  Tell your health care provider about any new moles or changes in moles, especially if there is a change in a mole's shape or color.  Also tell your health care provider if you have a mole that is larger than the size of a pencil eraser.  Always use sunscreen. Apply sunscreen liberally and repeatedly throughout the day.  Protect yourself by wearing long sleeves, pants, a wide-brimmed hat, and sunglasses whenever you are outside. Heart disease, diabetes, and high blood pressure  High blood pressure causes heart disease and increases the risk of stroke. High blood pressure is more likely to  develop in:  People who have blood pressure in the high end of the normal range (130-139/85-89 mm Hg).  People who are overweight or obese.  People who are African American.  If you are 42-77 years of age, have your blood pressure checked every 3-5 years. If you are 27 years of age or older, have your blood pressure checked every year. You should have your blood pressure measured twice-once when you are at a hospital or clinic, and once when you are not at a hospital or clinic. Record the average of the two measurements. To check your blood pressure when you are not at a hospital or clinic, you can use:  An automated blood pressure machine at a pharmacy.  A home blood pressure monitor.  If you are between 49 years and 34 years old, ask your health care provider if you should take aspirin to prevent strokes.  Have regular diabetes screenings. This involves taking a blood sample to check your  fasting blood sugar level.  If you are at a normal weight and have a low risk for diabetes, have this test once every three years after 35 years of age.  If you are overweight and have a high risk for diabetes, consider being tested at a younger age or more often. Preventing infection Hepatitis B  If you have a higher risk for hepatitis B, you should be screened for this virus. You are considered at high risk for hepatitis B if:  You were born in a country where hepatitis B is common. Ask your health care provider which countries are considered high risk.  Your parents were born in a high-risk country, and you have not been immunized against hepatitis B (hepatitis B vaccine).  You have HIV or AIDS.  You use needles to inject street drugs.  You live with someone who has hepatitis B.  You have had sex with someone who has hepatitis B.  You get hemodialysis treatment.  You take certain medicines for conditions, including cancer, organ transplantation, and autoimmune conditions. Hepatitis C  Blood testing is recommended for:  Everyone born from [birth year] through 1964.  Anyone with known risk factors for hepatitis C. Sexually transmitted infections (STIs)  You should be screened for sexually transmitted infections (STIs) including gonorrhea and chlamydia if:  You are sexually active and are younger than 35 years of age.  You are older than 35 years of age and your health care provider tells you that you are at risk for this type of infection.  Your sexual activity has changed since you were last screened and you are at an increased risk for chlamydia or gonorrhea. Ask your health care provider if you are at risk.  If you do not have HIV, but are at risk, it may be recommended that you take a prescription medicine daily to prevent HIV infection. This is called pre-exposure prophylaxis (PrEP). You are considered at risk if:  You are sexually active and do not regularly use condoms or  know the HIV status of your partner(s).  You take drugs by injection.  You are sexually active with a partner who has HIV. Talk with your health care provider about whether you are at high risk of being infected with HIV. If you choose to begin PrEP, you should first be tested for HIV. You should then be tested every 3 months for as long as you are taking PrEP. Pregnancy  If you are premenopausal and you may become pregnant, ask your health  care provider about preconception counseling.  If you may become pregnant, take 400 to 800 micrograms (mcg) of folic acid every day.  If you want to prevent pregnancy, talk to your health care provider about birth control (contraception). Osteoporosis and menopause  Osteoporosis is a disease in which the bones lose minerals and strength with aging. This can result in serious bone fractures. Your risk for osteoporosis can be identified using a bone density scan.  If you are 18 years of age or older, or if you are at risk for osteoporosis and fractures, ask your health care provider if you should be screened.  Ask your health care provider whether you should take a calcium or vitamin D supplement to lower your risk for osteoporosis.  Menopause may have certain physical symptoms and risks.  Hormone replacement therapy may reduce some of these symptoms and risks. Talk to your health care provider about whether hormone replacement therapy is right for you. Follow these instructions at home:  Schedule regular health, dental, and eye exams.  Stay current with your immunizations.  Do not use any tobacco products including cigarettes, chewing tobacco, or electronic cigarettes.  If you are pregnant, do not drink alcohol.  If you are breastfeeding, limit how much and how often you drink alcohol.  Limit alcohol intake to no more than 1 drink per day for nonpregnant women. One drink equals 12 ounces of beer, 5 ounces of wine, or 1 ounces of hard  liquor.  Do not use street drugs.  Do not share needles.  Ask your health care provider for help if you need support or information about quitting drugs.  Tell your health care provider if you often feel depressed.  Tell your health care provider if you have ever been abused or do not feel safe at home. This information is not intended to replace advice given to you by your health care provider. Make sure you discuss any questions you have with your health care provider. Document Released: 03/03/2011 Document Revised: 01/24/2016 Document Reviewed: 05/22/2015  2017 Elsevier

## 2016-10-10 IMAGING — CR DG CHEST 1V
1 series · 1 of 1 positions shown · non-contrast
Comparison: None.

CLINICAL DATA: Palpitations since this morning.  Initial encounter.

EXAM:
CHEST  1 VIEW

[portable]
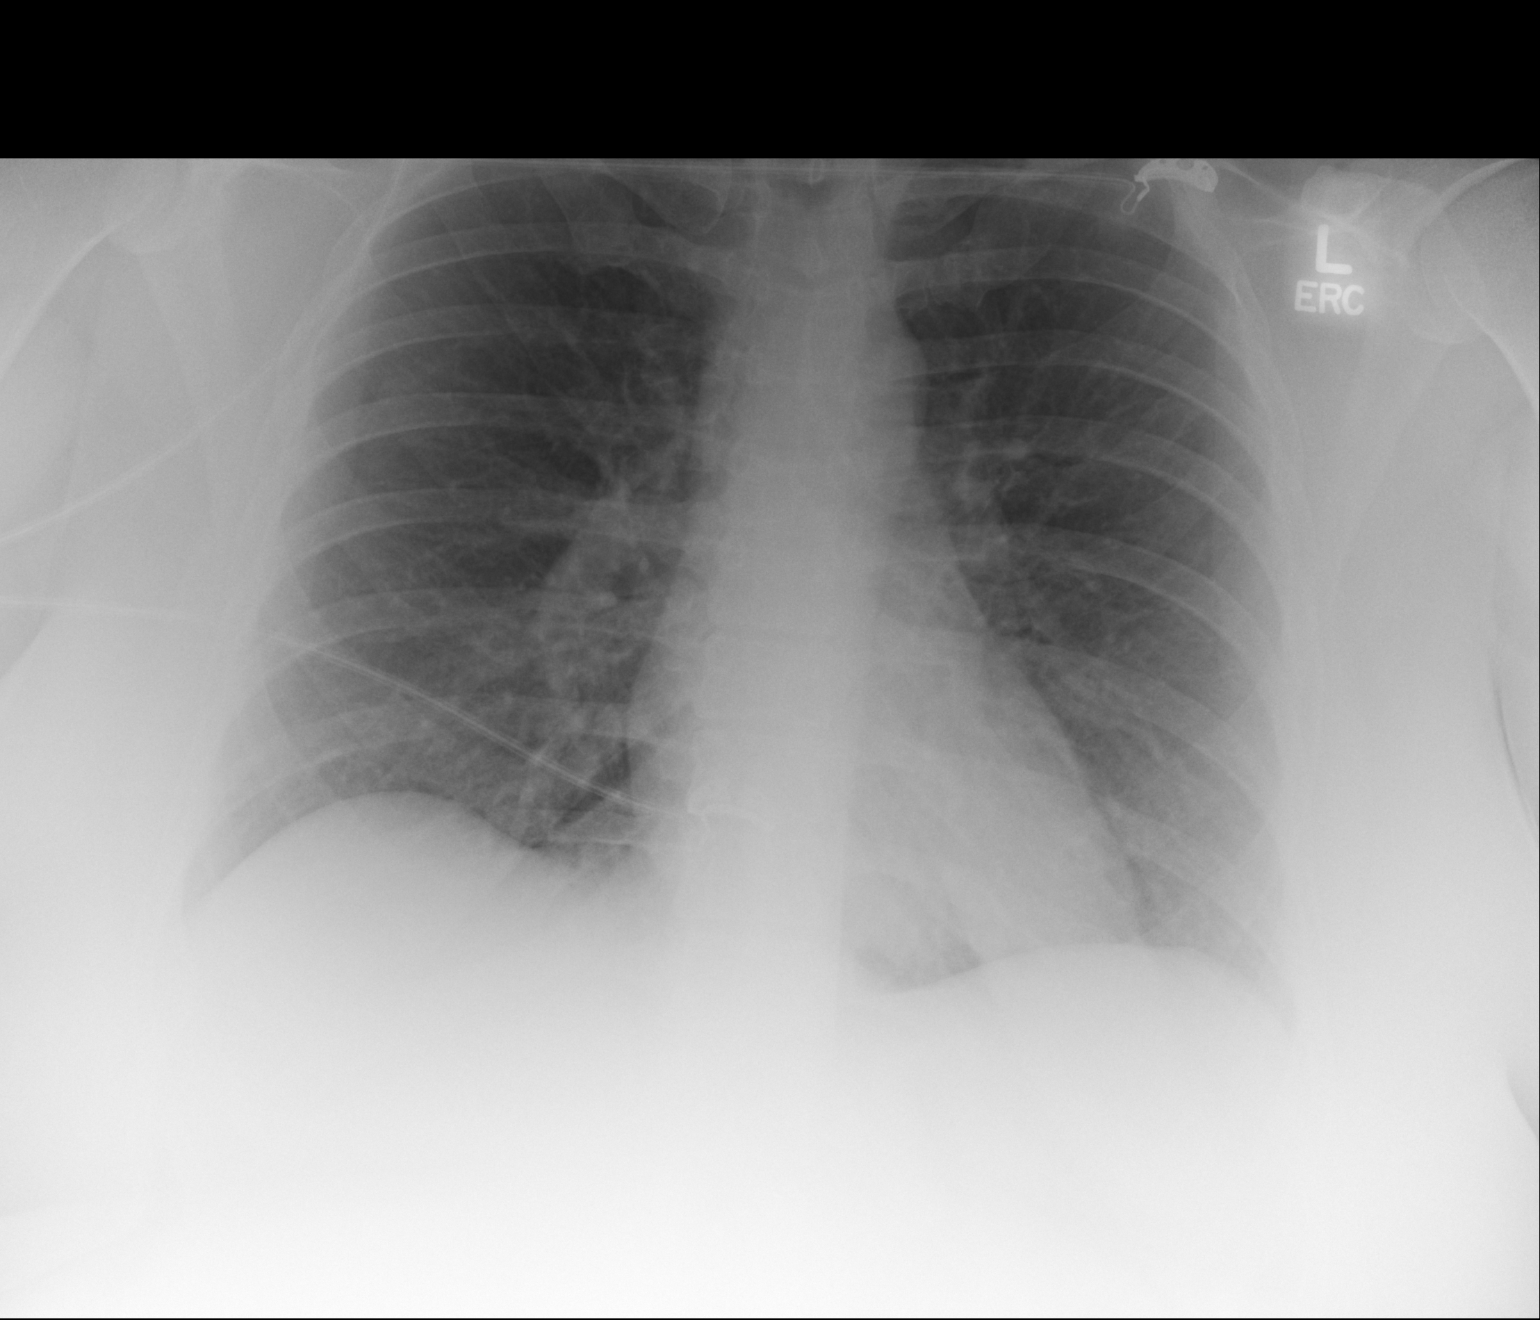

[1 of 1 positions shown; findings below may reference images not displayed]

FINDINGS: A single AP portable view of the chest demonstrates no focal
airspace consolidation or alveolar edema. The lungs are grossly
clear. There is no large effusion or pneumothorax. Cardiac and
mediastinal contours appear unremarkable.
IMPRESSION: No active disease.

## 2016-10-16 ENCOUNTER — Other Ambulatory Visit: Payer: Self-pay | Admitting: Nurse Practitioner

## 2016-10-21 ENCOUNTER — Encounter: Payer: Self-pay | Admitting: Family

## 2016-10-21 ENCOUNTER — Other Ambulatory Visit (HOSPITAL_COMMUNITY)
Admission: RE | Admit: 2016-10-21 | Discharge: 2016-10-21 | Disposition: A | Discharge status: Home / Self Care | Payer: BLUE CROSS/BLUE SHIELD | Source: Ambulatory Visit | Attending: Family | Admitting: Family

## 2016-10-21 ENCOUNTER — Ambulatory Visit (INDEPENDENT_AMBULATORY_CARE_PROVIDER_SITE_OTHER): Payer: BLUE CROSS/BLUE SHIELD | Admitting: Family

## 2016-10-21 VITALS — BP 126/82 | HR 86 | Temp 98.2°F | Wt 237.2 lb

## 2016-10-21 DIAGNOSIS — K649 Unspecified hemorrhoids: Secondary | ICD-10-CM

## 2016-10-21 DIAGNOSIS — Z124 Encounter for screening for malignant neoplasm of cervix: Secondary | ICD-10-CM | POA: Insufficient documentation

## 2016-10-21 DIAGNOSIS — Z1151 Encounter for screening for human papillomavirus (HPV): Secondary | ICD-10-CM | POA: Insufficient documentation

## 2016-10-21 DIAGNOSIS — Z01411 Encounter for gynecological examination (general) (routine) with abnormal findings: Secondary | ICD-10-CM | POA: Insufficient documentation

## 2016-10-21 DIAGNOSIS — Z01419 Encounter for gynecological examination (general) (routine) without abnormal findings: Secondary | ICD-10-CM | POA: Diagnosis not present

## 2016-10-21 LAB — LIPID PANEL
CHOLESTEROL: 192 mg/dL (ref 0–200)
HDL: 38.2 mg/dL — ABNORMAL LOW (ref >39.00)
LDL CALC: 133 mg/dL — AB (ref 0–99)
NonHDL: 153.37
Total CHOL/HDL Ratio: 5
Triglycerides: 101 mg/dL (ref 0.0–149.0)
VLDL: 20.2 mg/dL (ref 0.0–40.0)

## 2016-10-21 LAB — CBC WITH DIFFERENTIAL/PLATELET
BASOS ABS: 0.1 10*3/uL (ref 0.0–0.1)
Basophils Relative: 0.6 % (ref 0.0–3.0)
EOS ABS: 0.3 10*3/uL (ref 0.0–0.7)
Eosinophils Relative: 3.3 % (ref 0.0–5.0)
HEMATOCRIT: 40.3 % (ref 36.0–46.0)
Hemoglobin: 13.6 g/dL (ref 12.0–15.0)
LYMPHS PCT: 30.9 % (ref 12.0–46.0)
Lymphs Abs: 2.7 10*3/uL (ref 0.7–4.0)
MCHC: 33.9 g/dL (ref 30.0–36.0)
MCV: 90.7 fl (ref 78.0–100.0)
MONOS PCT: 4.5 % (ref 3.0–12.0)
Monocytes Absolute: 0.4 10*3/uL (ref 0.1–1.0)
Neutro Abs: 5.4 10*3/uL (ref 1.4–7.7)
Neutrophils Relative %: 60.7 % (ref 43.0–77.0)
PLATELETS: 342 10*3/uL (ref 150.0–400.0)
RBC: 4.44 Mil/uL (ref 3.87–5.11)
RDW: 13.6 % (ref 11.5–15.5)
WBC: 8.9 10*3/uL (ref 4.0–10.5)

## 2016-10-21 LAB — COMPREHENSIVE METABOLIC PANEL
ALBUMIN: 4.1 g/dL (ref 3.5–5.2)
ALT: 20 U/L (ref 0–35)
AST: 12 U/L (ref 0–37)
Alkaline Phosphatase: 77 U/L (ref 39–117)
BILIRUBIN TOTAL: 0.4 mg/dL (ref 0.2–1.2)
BUN: 11 mg/dL (ref 6–23)
CHLORIDE: 108 meq/L (ref 96–112)
CO2: 25 mEq/L (ref 19–32)
CREATININE: 0.56 mg/dL (ref 0.40–1.20)
Calcium: 8.9 mg/dL (ref 8.4–10.5)
GFR: 131.25 mL/min (ref >60.00)
Glucose, Bld: 92 mg/dL (ref 70–99)
Potassium: 4.1 mEq/L (ref 3.5–5.1)
SODIUM: 137 meq/L (ref 135–145)
TOTAL PROTEIN: 6.8 g/dL (ref 6.0–8.3)

## 2016-10-21 LAB — VITAMIN D 25 HYDROXY (VIT D DEFICIENCY, FRACTURES): VITD: 23.5 ng/mL — AB (ref 30.00–100.00)

## 2016-10-21 LAB — TSH: TSH: 1.18 u[IU]/mL (ref 0.35–4.50)

## 2016-10-21 LAB — HEMOGLOBIN A1C: Hgb A1c MFr Bld: 5.4 % (ref 4.6–6.5)

## 2016-10-21 NOTE — Progress Notes (Signed)
Pre visit review using our clinic review tool, if applicable. No additional management support is needed unless otherwise documented below in the visit note. 

## 2016-10-21 NOTE — Addendum Note (Signed)
Addended by: Penne LashWIGGINS, Prosperity Darrough N on: 10/21/2016 11:07 AM   Modules accepted: Orders

## 2016-10-21 NOTE — Patient Instructions (Signed)
Will call with pap result  Let me know if you would like a referral to gi at any point for hemorrhoid.

## 2016-10-21 NOTE — Addendum Note (Signed)
Addended by: Penne LashWIGGINS, Latreshia Beauchaine N on: 10/21/2016 10:57 AM   Modules accepted: Orders

## 2016-10-21 NOTE — Assessment & Plan Note (Signed)
Stable. No active bleeding or thrombus. Declined referral to GI for removal. Will let me know if worsens.

## 2016-10-21 NOTE — Addendum Note (Signed)
Addended by: Allegra GranaARNETT, MARGARET G on: 10/21/2016 11:09 AM   Modules accepted: Orders

## 2016-10-21 NOTE — Assessment & Plan Note (Signed)
Pap performed.

## 2016-10-21 NOTE — Progress Notes (Addendum)
Subjective:    Patient ID: Tiffany Jackson, female    DOB: Oct 17, 1980, 36 y.o.   MRN: [id number]  CC: Tiffany Jackson is a 36 y.o. female who presents today for physical exam.    HPI: Overall doing well.   Here for pap as not done at [facility] due to menstrual cycle and patient preference.   No improvement of hemorrhoids with cream. "doesn't bother me'. 'Just there.' No bleeding. Eating diet with lots of fiber. No straining.    HISTORY:  Past Medical History:  Diagnosis Date  . Allergy    Seasonal  . Chicken pox   . Depression   . Frequent headaches   . Hypertension   . Migraines     Past Surgical History:  Procedure Laterality Date  . CESAREAN SECTION     2003, 2005, 2009  . CHOLECYSTECTOMY N/A 04/04/2015   Procedure: LAPAROSCOPIC CHOLECYSTECTOMY;  Surgeon: Natale LayMark Bird, MD;  Location: [facility] ORS;  Service: General;  Laterality: N/A;   Family History  Problem Relation Age of Onset  . Multiple sclerosis Mother   . Hypertension Father   . Cancer Maternal Grandmother 50    Breast Cancer  . Cancer Paternal Grandfather     Lung Cancer  . Anuerysm Paternal Grandmother   . Heart disease Daughter       ALLERGIES: Patient has no known allergies.  Current Outpatient Prescriptions on File Prior to Visit  Medication Sig Dispense Refill  . albuterol (PROVENTIL HFA;VENTOLIN HFA) 108 (90 BASE) MCG/ACT inhaler Inhale 2 puffs into the lungs as needed for wheezing or shortness of breath. 1 Inhaler 1  . ALPRAZolam (XANAX) 0.25 MG tablet TAKE 1 TABLET BY MOUTH TWICE DAILY AS NEEDED FOR ANXIETY 60 tablet 0  . amLODipine (NORVASC) 5 MG tablet TAKE 1 TABLET(5 MG) BY MOUTH DAILY 90 tablet 3  . hydrocortisone (ANUSOL-HC) 2.5 % rectal cream Place 1 application rectally 2 (two) times daily. 30 g 1  . venlafaxine XR (EFFEXOR-XR) 75 MG 24 hr capsule TAKE 1 CAPSULE(75 MG) BY MOUTH DAILY WITH BREAKFAST 30 capsule 2  . Vitamin D, Cholecalciferol, 1000 UNITS TABS Take by mouth daily.     No current  facility-administered medications on file prior to visit.     Social History  Substance Use Topics  . Smoking status: Former Smoker    Types: Cigarettes    Quit date: 07/02/2013  . Smokeless tobacco: Never Used  . Alcohol use No    Review of Systems  Constitutional: Negative for chills and fever.  Respiratory: Negative for cough.   Cardiovascular: Negative for chest pain and palpitations.  Gastrointestinal: Negative for abdominal pain, anal bleeding, blood in stool, nausea and vomiting.  Genitourinary: Negative for dyspareunia, dysuria, vaginal discharge and vaginal pain.      Objective:    BP 126/82 (BP Location: Left Arm, Patient Position: Sitting, Cuff Size: Large)   Pulse 86   Temp 98.2 F (36.8 C) (Oral)   Wt 237 lb 3.2 oz (107.6 kg)   LMP 10/08/2016 (Exact Date)   SpO2 99%   BMI 41.36 kg/m   BP Readings from Last 3 Encounters:  10/21/16 126/82  10/09/16 136/88  04/01/16 126/86   Wt Readings from Last 3 Encounters:  10/21/16 237 lb 3.2 oz (107.6 kg)  10/09/16 238 lb 3.2 oz (108 kg)  04/01/16 228 lb 6 oz (103.6 kg)    Physical Exam  Constitutional: She appears well-developed and well-nourished.  Eyes: Conjunctivae are normal.  Cardiovascular: Normal rate, regular  rhythm, normal heart sounds and normal pulses.   Pulmonary/Chest: Effort normal and breath sounds normal. She has no wheezes. She has no rhonchi. She has no rales.  Genitourinary: Rectal exam shows external hemorrhoid. Rectal exam shows no internal hemorrhoid, no fissure and no tenderness. Uterus is not tender. Cervix exhibits no motion tenderness, no discharge and no friability. Right adnexum displays no mass, no tenderness and no fullness. Left adnexum displays no mass, no tenderness and no fullness.  Genitourinary Comments: One approx 2cm skin-colored prolapsed internal hemorrhoid. No skin tags noted around anus. No purple discoloration or bleeding noted. No thrombus appreciated in hemorrhoid.  Pap  preformed. No CMT.    Neurological: She is alert.  Skin: Skin is warm and dry.  Psychiatric: She has a normal mood and affect. Her speech is normal and behavior is normal. Thought content normal.  Vitals reviewed.      Assessment & Plan:   Problem List Items Addressed This Visit      Cardiovascular and Mediastinum   Hemorrhoids    Stable. No active bleeding or thrombus. Declined referral to GI for removal. Will let me know if worsens.        Other   Screening for cervical cancer - Primary    Pap performed.       Relevant Orders   CBC with Differential   Hemoglobin A1c   Comprehensive metabolic panel   Lipid panel   VITAMIN D 25 Hydroxy (Vit-D Deficiency, Fractures)   TSH   Cytology - PAP       I am having Ms. Santone maintain her albuterol, Vitamin D (Cholecalciferol), ALPRAZolam, amLODipine, hydrocortisone, and venlafaxine XR.   No orders of the defined types were placed in this encounter.   Return precautions given.   Risks, benefits, and alternatives of the medications and treatment plan prescribed today were discussed, and patient expressed understanding.   Education regarding symptom management and diagnosis given to patient on AVS.   Continue to follow with Rennie Plowman, FNP for routine health maintenance.   Tiffany Fossa and I agreed with plan.   Rennie Plowman, FNP

## 2016-10-23 ENCOUNTER — Encounter: Payer: Self-pay | Admitting: Family

## 2016-10-23 LAB — CYTOLOGY - PAP
Diagnosis: NEGATIVE
HPV: NOT DETECTED

## 2016-11-08 IMAGING — CT CT ANGIO CHEST
1 of 2 series · 18 of 30 positions shown · IV contrast (APPLIED)
Comparison: 04/05/2015

CLINICAL DATA: Mid chest pain developed yesterday. Increasing with
inspiration. Shortness of breath with exertion.

EXAM:
CT ANGIOGRAPHY CHEST WITH CONTRAST
TECHNIQUE: Multidetector CT imaging of the chest was performed using the
standard protocol during bolus administration of intravenous
contrast. Multiplanar CT image reconstructions and MIPs were
obtained to evaluate the vascular anatomy.
CONTRAST:  100mL OMNIPAQUE IOHEXOL 350 MG/ML SOLN

[Series 5: pe 1.0 thins · axial · 0.73mm/px · z∈[-706,-476]mm · 18 of 261 slices shown]
[im 15/261  lung]
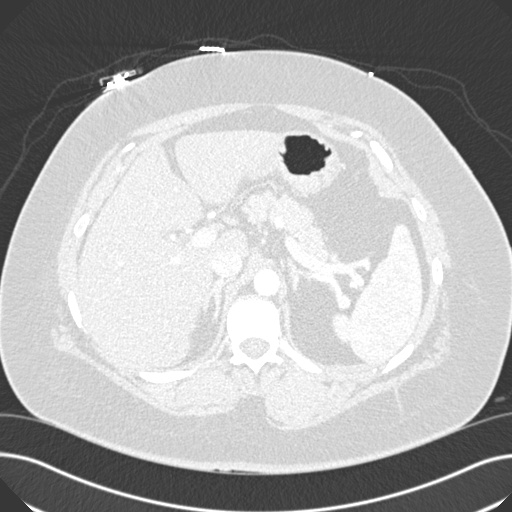
[im 29/261  mediastinal]
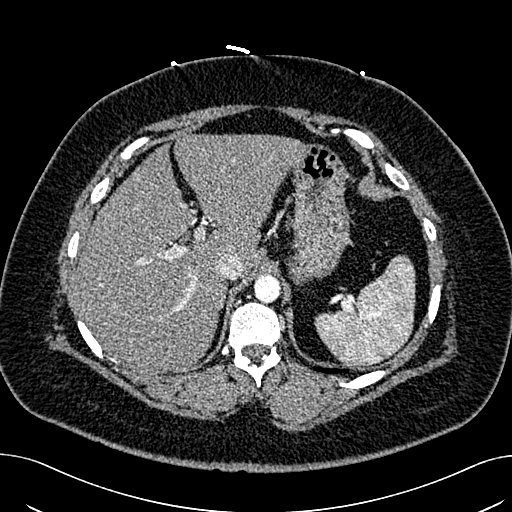
[im 44/261  lung]
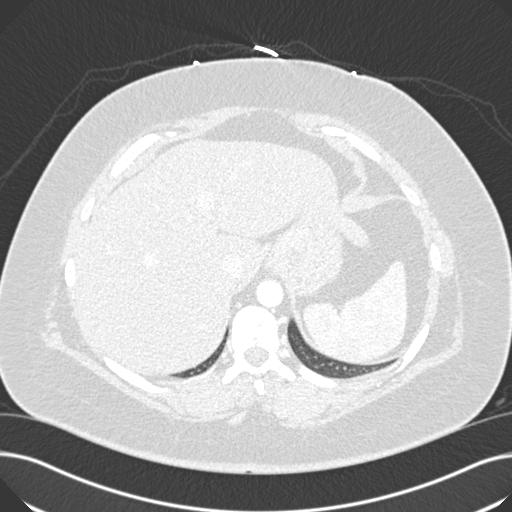
[im 58/261  mediastinal]
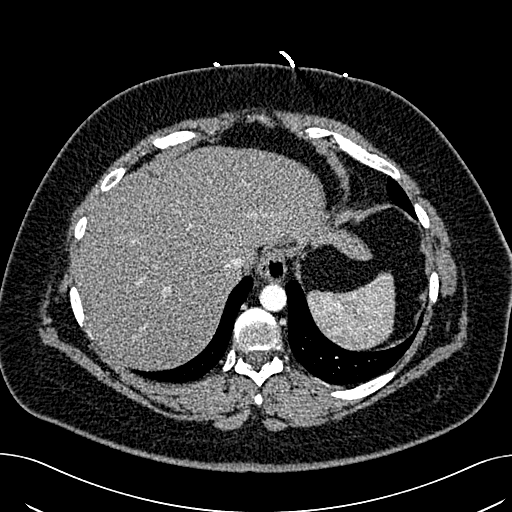
[im 73/261  lung]
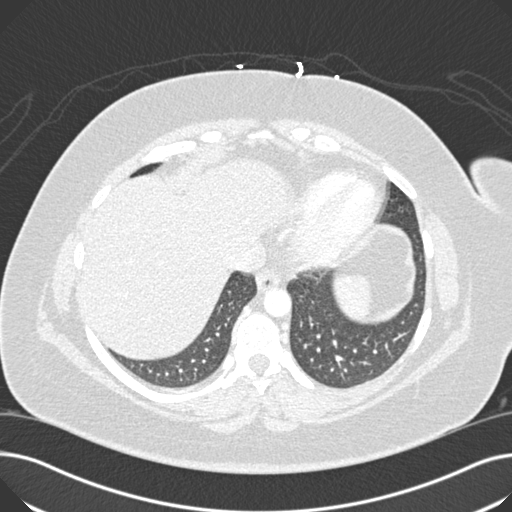
[im 87/261  mediastinal]
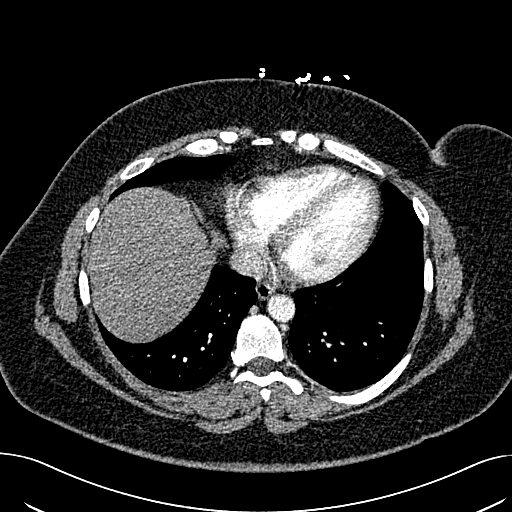
[im 102/261  lung]
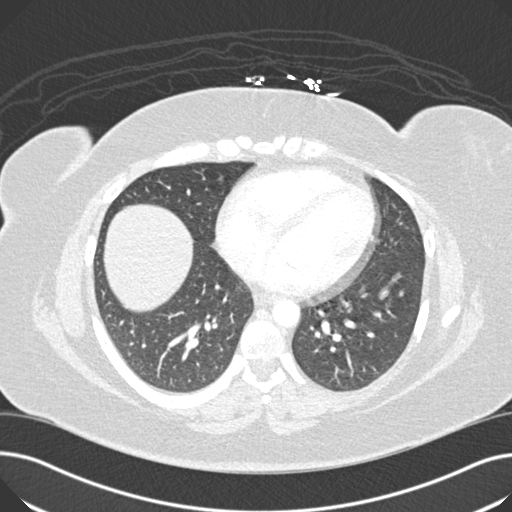
[im 116/261  mediastinal]
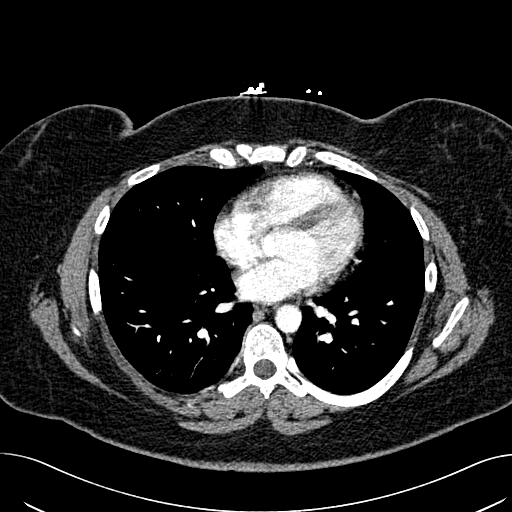
[im 121/261  lung]
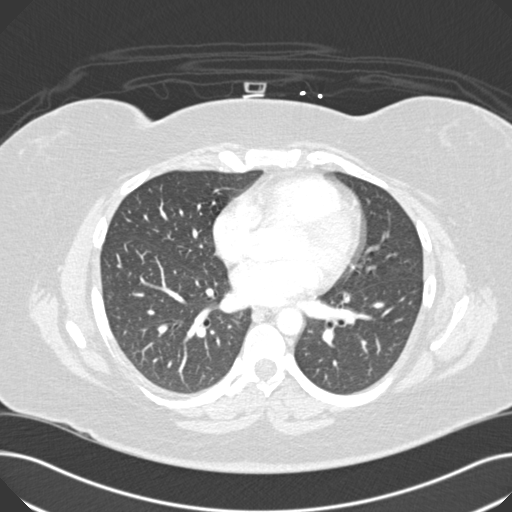
[im 131/261  mediastinal]
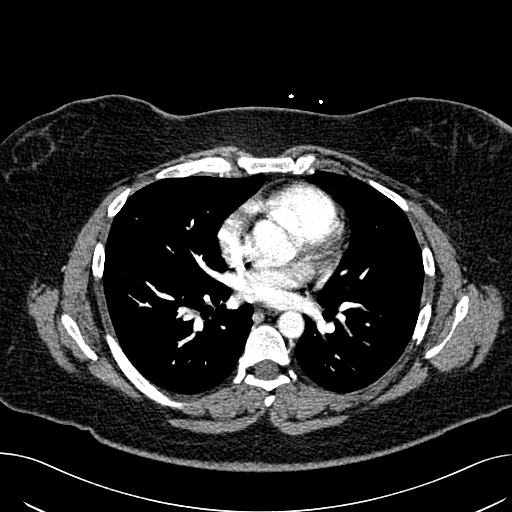
[im 145/261  lung]
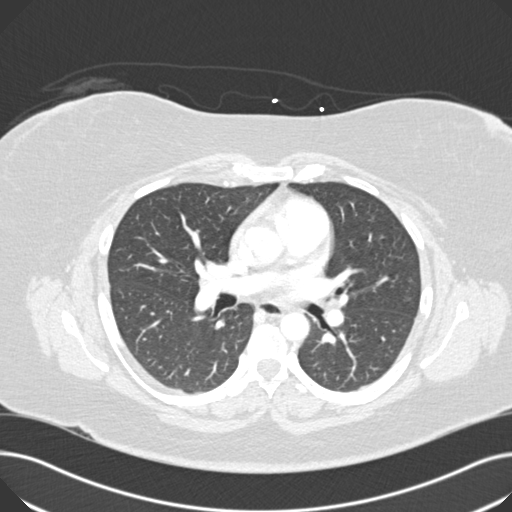
[im 159/261  mediastinal]
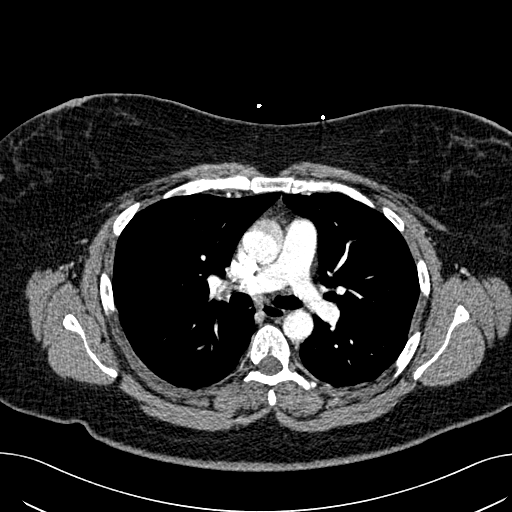
[im 174/261  lung]
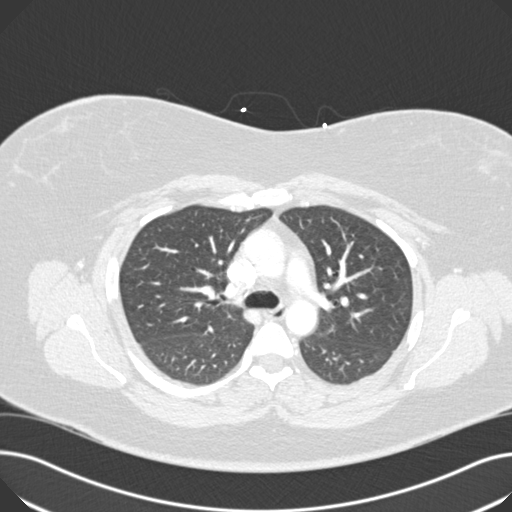
[im 188/261  mediastinal]
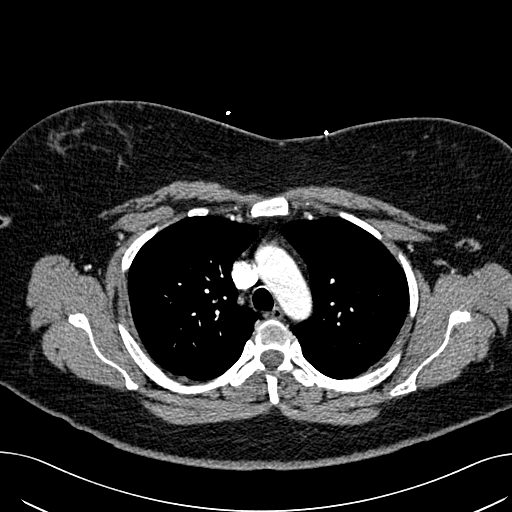
[im 203/261  lung]
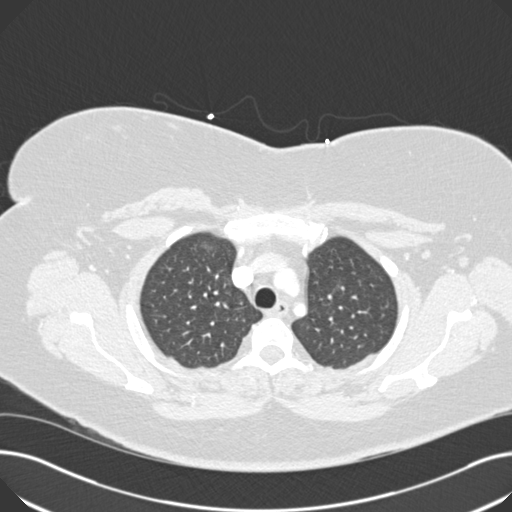
[im 217/261  mediastinal]
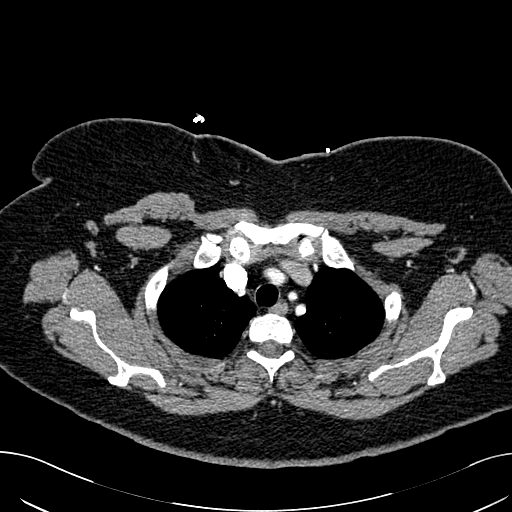
[im 232/261  lung]
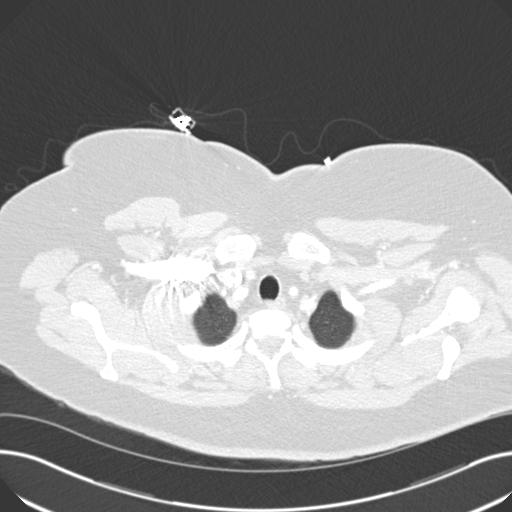
[im 246/261  mediastinal]
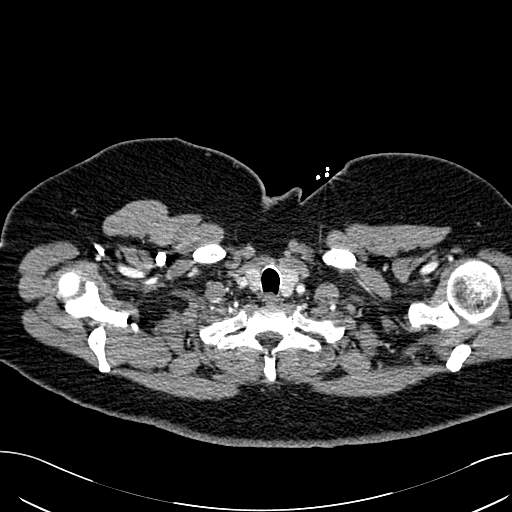

[18 of 30 positions shown; findings below may reference images not displayed]

FINDINGS: No filling defects in the pulmonary arteries to suggest pulmonary
emboli. Heart is normal size. Aorta is normal caliber. No
mediastinal, hilar, or axillary adenopathy. Chest wall soft tissues
are unremarkable.

Lungs are clear. No focal airspace opacities or suspicious nodules.
No effusions. Imaging into the upper abdomen shows no acute
findings. Diffuse fatty infiltration of the liver. No acute bony
abnormality or focal bone lesion.

Review of the MIP images confirms the above findings.
IMPRESSION: No evidence of pulmonary embolus.

No acute findings.

Fatty liver.

## 2016-12-03 ENCOUNTER — Encounter: Payer: Self-pay | Admitting: Family

## 2016-12-12 ENCOUNTER — Ambulatory Visit (INDEPENDENT_AMBULATORY_CARE_PROVIDER_SITE_OTHER): Payer: BLUE CROSS/BLUE SHIELD | Admitting: Family Medicine

## 2016-12-12 ENCOUNTER — Encounter: Payer: Self-pay | Admitting: Family Medicine

## 2016-12-12 ENCOUNTER — Encounter: Payer: Self-pay | Admitting: Family

## 2016-12-12 VITALS — BP 132/88 | HR 90 | Temp 97.8°F | Wt 241.5 lb

## 2016-12-12 DIAGNOSIS — J0100 Acute maxillary sinusitis, unspecified: Secondary | ICD-10-CM | POA: Diagnosis not present

## 2016-12-12 MED ORDER — AMOXICILLIN-POT CLAVULANATE 875-125 MG PO TABS: 1.0000 | ORAL_TABLET | Freq: Two times a day (BID) | ORAL | 0 refills | Status: DC

## 2016-12-12 NOTE — Patient Instructions (Signed)

## 2016-12-12 NOTE — Progress Notes (Signed)
Pre visit review using our clinic review tool, if applicable. No additional management support is needed unless otherwise documented below in the visit note. 

## 2016-12-12 NOTE — Progress Notes (Signed)
Subjective:  Patient ID: Tiffany Jackson, female    DOB: 09-28-1980  Age: 36 y.o. MRN: [id number]  CC: ? Sinusitis  HPI:  36 year old female presents with concern for sinusitis.  Patient reports a two-week history of right maxillary sinus pain and pressure as well as pain in the right ear. She reports associated dental discomfort. No reports of purulent nasal discharge. No fevers or chills. No known exacerbating factors. She's taken over-the-counter Tylenol Cold and sinus without improvement. Symptoms are severe and worsening. No other associated symptoms. No other complaints at this time.  Social Hx   Social History   Social History  . Marital status: Married    Spouse name: N/A  . Number of children: N/A  . Years of education: N/A   Social History Main Topics  . Smoking status: Former Smoker    Types: Cigarettes    Quit date: 07/02/2013  . Smokeless tobacco: Never Used  . Alcohol use No  . Drug use: No  . Sexual activity: Yes    Partners: Male    Birth control/ protection: Surgical     Comment: Husband   Other Topics Concern  . 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  HENT: Positive for congestion, sinus pain and sinus pressure.    Objective:  BP 132/88   Pulse 90   Temp 97.8 F (36.6 C) (Oral)   Wt 241 lb 8 oz (109.5 kg)   SpO2 98%   BMI 42.11 kg/m   BP/Weight 12/12/2016 10/21/2016 10/09/2016  Systolic BP 132 126 136  Diastolic BP 88 82 88  Wt. (Lbs) 241.5 237.2 238.2  BMI 42.11 41.36 41.53    Physical Exam  Constitutional: She is oriented to person, place, and time. She appears well-developed. No distress.  HENT:  Mouth/Throat: Oropharynx is clear and moist.  Severe right maxillary sinus tenderness to palpation. Normal TMs.  Cardiovascular: Normal rate and  regular rhythm.   Pulmonary/Chest: Effort normal and breath sounds normal.  Neurological: She is alert and oriented to person, place, and time.  Psychiatric: She has a normal mood and affect.  Vitals reviewed.  Lab Results  Component Value Date   WBC 8.9 10/21/2016   HGB 13.6 10/21/2016   HCT 40.3 10/21/2016   PLT 342.0 10/21/2016   GLUCOSE 92 10/21/2016   CHOL 192 10/21/2016   TRIG 101.0 10/21/2016   HDL 38.20 (L) 10/21/2016   LDLCALC 133 (H) 10/21/2016   ALT 20 10/21/2016   AST 12 10/21/2016   NA 137 10/21/2016   K 4.1 10/21/2016   CL 108 10/21/2016   CREATININE 0.56 10/21/2016   BUN 11 10/21/2016   CO2 25 10/21/2016   TSH 1.18 10/21/2016   HGBA1C 5.4 10/21/2016    Assessment & Plan:   Problem List Items Addressed This Visit    Acute non-recurrent maxillary sinusitis - Primary    New acute problem. Treating with Augmentin.      Relevant Medications   amoxicillin-clavulanate (AUGMENTIN) 875-125 MG tablet      Meds ordered this encounter  Medications  . amoxicillin-clavulanate (AUGMENTIN) 875-125 MG tablet    Sig: Take 1 tablet by mouth 2 (two) times daily.    Dispense:  20 tablet  Refill:  0   Follow-up: PRN  [place]

## 2016-12-12 NOTE — Assessment & Plan Note (Signed)
New acute problem. Treating with Augmentin. 

## 2016-12-22 ENCOUNTER — Telehealth: Payer: Self-pay | Admitting: Family

## 2016-12-22 DIAGNOSIS — T3695XA Adverse effect of unspecified systemic antibiotic, initial encounter: Principal | ICD-10-CM

## 2016-12-22 DIAGNOSIS — B379 Candidiasis, unspecified: Secondary | ICD-10-CM

## 2016-12-22 MED ORDER — FLUCONAZOLE 150 MG PO TABS: 150.0000 mg | ORAL_TABLET | Freq: Once | ORAL | 1 refills | Status: AC

## 2016-12-22 NOTE — Telephone Encounter (Signed)
Reason for call: yeast infection  Symptoms: itching, burning, white discharge ,no smelling discharge Duration 4 days  Medications: monistat OTC  Last seen for this problem:4/13 Seen AV:WUJW

## 2016-12-22 NOTE — Telephone Encounter (Signed)
Pt called and stated that she came in to see Dr. Adriana Simas for Sinusitis, and has now developed a yeast infection from the medication. Please advise, thank you!  Call pt @ [phone]

## 2016-12-22 NOTE — Telephone Encounter (Signed)
Left voice mail to call back 

## 2016-12-22 NOTE — Telephone Encounter (Signed)
Patient advised of below and verbalized an understanding  

## 2016-12-22 NOTE — Telephone Encounter (Signed)
Pt called back returning your call. Thank you!  Call pt @ [phone]

## 2016-12-22 NOTE — Telephone Encounter (Signed)
Call pt  Suspect yeast infection from augmentin dr Adriana Simas gave her  Sent in diflucan  If not better, she needs ov

## 2016-12-29 NOTE — Progress Notes (Signed)
Subjective:    Patient ID: Tiffany Jackson, female    DOB: [birth year], 36 y.o.   MRN: [id number]  CC: Tiffany Jackson is a 36 y.o. female who presents today for follow up.   HPI: Rash in pelvis area for 2 weeks, waxing and waning. At the same, had a yeast infection which resolved with antifungal.  Was seen 4/13 for sinusitis and started on augmentin with improvement. Had changed detergents and started augmentin at time rash started and wandering if contributing. Draining yesterday.  Tried topical antifungal yesterday and burning improved. Tried benadryl without relief.   Rash has now moved to bilateral wrists. NO sob, trouble swallowing, N, F, vomiting.   Would like to have vitamin d rechecked as has been on 50,000 units once per week.     HISTORY:  Past Medical History:  Diagnosis Date  . Allergy    Seasonal  . Chicken pox   . Depression   . Frequent headaches   . Hypertension   . Migraines    Past Surgical History:  Procedure Laterality Date  . CESAREAN SECTION     2003, 2005, 2009  . CHOLECYSTECTOMY N/A 04/04/2015   Procedure: LAPAROSCOPIC CHOLECYSTECTOMY;  Surgeon: Natale Lay, MD;  Location: [facility] ORS;  Service: General;  Laterality: N/A;   Family History  Problem Relation Age of Onset  . Multiple sclerosis Mother   . Hypertension Father   . Cancer Maternal Grandmother 50    Breast Cancer  . Cancer Paternal Grandfather     Lung Cancer  . Anuerysm Paternal Grandmother   . Heart disease Daughter     Allergies: Augmentin [amoxicillin-pot clavulanate] Current Outpatient Prescriptions on File Prior to Visit  Medication Sig Dispense Refill  . albuterol (PROVENTIL HFA;VENTOLIN HFA) 108 (90 BASE) MCG/ACT inhaler Inhale 2 puffs into the lungs as needed for wheezing or shortness of breath. 1 Inhaler 1  . ALPRAZolam (XANAX) 0.25 MG tablet TAKE 1 TABLET BY MOUTH TWICE DAILY AS NEEDED FOR ANXIETY 60 tablet 0  . amLODipine (NORVASC) 5 MG tablet TAKE 1 TABLET(5 MG) BY MOUTH DAILY 90  tablet 3  . hydrocortisone (ANUSOL-HC) 2.5 % rectal cream Place 1 application rectally 2 (two) times daily. 30 g 1  . venlafaxine XR (EFFEXOR-XR) 75 MG 24 hr capsule TAKE 1 CAPSULE(75 MG) BY MOUTH DAILY WITH BREAKFAST 30 capsule 2  . Vitamin D, Cholecalciferol, 1000 UNITS TABS Take by mouth daily.    Marland Kitchen amoxicillin-clavulanate (AUGMENTIN) 875-125 MG tablet Take 1 tablet by mouth 2 (two) times daily. (Patient not taking: Reported on 12/30/2016) 20 tablet 0   No current facility-administered medications on file prior to visit.     Social History  Substance Use Topics  . Smoking status: Former Smoker    Types: Cigarettes    Quit date: 07/02/2013  . Smokeless tobacco: Never Used  . Alcohol use No    Review of Systems  Constitutional: Negative for chills and fever.  HENT: Negative for trouble swallowing.   Respiratory: Negative for cough and shortness of breath.   Cardiovascular: Negative for chest pain and palpitations.  Gastrointestinal: Negative for abdominal pain, nausea and vomiting.  Genitourinary: Negative for vaginal discharge.  Musculoskeletal: Negative for arthralgias.  Skin: Positive for rash.  Neurological: Negative for dizziness and headaches.      Objective:    BP 118/78 (BP Location: Left Arm, Patient Position: Sitting, Cuff Size: Large)   Pulse 98   Temp 97.6 F (36.4 C) (Oral)   Resp 18  Wt 237 lb 8 oz (107.7 kg)   SpO2 98%   BMI 41.41 kg/m  BP Readings from Last 3 Encounters:  12/30/16 118/78  12/12/16 132/88  10/21/16 126/82   Wt Readings from Last 3 Encounters:  12/30/16 237 lb 8 oz (107.7 kg)  12/12/16 241 lb 8 oz (109.5 kg)  10/21/16 237 lb 3.2 oz (107.6 kg)    Physical Exam  Constitutional: She appears well-developed and well-nourished.  Eyes: Conjunctivae are normal.  Cardiovascular: Normal rate, regular rhythm, normal heart sounds and normal pulses.   Pulmonary/Chest: Effort normal and breath sounds normal. She has no wheezes. She has no  rhonchi. She has no rales.  Neurological: She is alert.  Skin: Skin is warm and dry. Rash noted. Rash is not maculopapular and not vesicular.  Hyper erythematous area noted bilateral groin, wrists. No discharge, streaking, papules, vescicles . Skin intact. Erythema noted across chest, no papules.   Psychiatric: She has a normal mood and affect. Her speech is normal and behavior is normal. Thought content normal.  Vitals reviewed.      Assessment & Plan:   Problem List Items Addressed This Visit      Other   Vitamin D deficiency    Rechecking today after 50,000 units Q weekly      Relevant Orders   VITAMIN D 25 Hydroxy (Vit-D Deficiency, Fractures)   Candidiasis of genitalia in female - Primary    No SOB. Patient is well appearing. Symptoms most consistent with candidiasis due to profound itching and response to earlier Diflucan. We'll try Diflucan again. Also considering contact dermatitis or allergy to Augmentin as rash correlates onset of medication. Patient and I jointly decided that she would try Diflucan first with 2 doses and if no relief, she would start prednisone, Zyrtec, Pepcid AC for contact dermatitis/ allergic reaction. She will let me know if not better.      Relevant Medications   triamcinolone (KENALOG) 0.025 % cream   predniSONE (DELTASONE) 10 MG tablet   fluconazole (DIFLUCAN) 150 MG tablet       I am having Ms. Lumley start on triamcinolone, predniSONE, and fluconazole. I am also having her maintain her albuterol, Vitamin D (Cholecalciferol), ALPRAZolam, amLODipine, hydrocortisone, venlafaxine XR, and amoxicillin-clavulanate.   Meds ordered this encounter  Medications  . triamcinolone (KENALOG) 0.025 % cream    Sig: Apply 1 application topically 2 (two) times daily.    Dispense:  15 g    Refill:  0    Order Specific Question:   Supervising Provider    Answer:   Darrick Huntsman, TERESA L [[id number]]  . predniSONE (DELTASONE) 10 MG tablet    Sig: Take 4 tablets ( total 40  mg) by mouth for 2 days; take 3 tablets ( total 30 mg) by mouth for 2 days; take 2 tablets ( total 20 mg) by mouth for 1 day; take 1 tablet ( total 10 mg) by mouth for 1 day.    Dispense:  17 tablet    Refill:  0    Order Specific Question:   Supervising Provider    Answer:   Duncan Dull L [[id number]]  . fluconazole (DIFLUCAN) 150 MG tablet    Sig: Take 1 tablet (150 mg total) by mouth once. Take one tablet PO once. If continue to have symptoms, may take one tablet PO 3 days later.    Dispense:  2 tablet    Refill:  1    Order Specific Question:   Supervising Provider  Answer:   Sherlene Shams [[id number]]    Return precautions given.   Risks, benefits, and alternatives of the medications and treatment plan prescribed today were discussed, and patient expressed understanding.   Education regarding symptom management and diagnosis given to patient on AVS.  Continue to follow with Rennie Plowman, FNP for routine health maintenance.   Tiffany Jackson and I agreed with plan.   Rennie Plowman, FNP

## 2016-12-30 ENCOUNTER — Encounter: Payer: Self-pay | Admitting: Family

## 2016-12-30 ENCOUNTER — Ambulatory Visit (INDEPENDENT_AMBULATORY_CARE_PROVIDER_SITE_OTHER): Payer: BLUE CROSS/BLUE SHIELD | Admitting: Family

## 2016-12-30 VITALS — BP 118/78 | HR 98 | Temp 97.6°F | Resp 18 | Wt 237.5 lb

## 2016-12-30 DIAGNOSIS — B3731 Acute candidiasis of vulva and vagina: Secondary | ICD-10-CM | POA: Insufficient documentation

## 2016-12-30 DIAGNOSIS — E559 Vitamin D deficiency, unspecified: Secondary | ICD-10-CM

## 2016-12-30 DIAGNOSIS — B373 Candidiasis of vulva and vagina: Secondary | ICD-10-CM | POA: Diagnosis not present

## 2016-12-30 LAB — VITAMIN D 25 HYDROXY (VIT D DEFICIENCY, FRACTURES): VITD: 53.37 ng/mL (ref 30.00–100.00)

## 2016-12-30 MED ORDER — PREDNISONE 10 MG PO TABS: ORAL_TABLET | ORAL | 0 refills | Status: DC

## 2016-12-30 MED ORDER — FLUCONAZOLE 150 MG PO TABS: 150.0000 mg | ORAL_TABLET | Freq: Once | ORAL | 1 refills | Status: AC

## 2016-12-30 MED ORDER — TRIAMCINOLONE ACETONIDE 0.025 % EX CREA: 1.0000 "application " | TOPICAL_CREAM | Freq: Two times a day (BID) | CUTANEOUS | 0 refills | Status: DC

## 2016-12-30 NOTE — Patient Instructions (Signed)
Start diflucan first as suspect yeast-  Take second dose at 72 hours.   May also use topical steroid cream on wrists, chest for relief.   If no improvement, then suspect contact dermatitis and you would need to start prednisone taper.   You may also start daily Claritin and pepcid AC twice per day for full histamine blockage as discussed.   Let me know if not better

## 2016-12-30 NOTE — Assessment & Plan Note (Signed)
No SOB. Patient is well appearing. Symptoms most consistent with candidiasis due to profound itching and response to earlier Diflucan. We'll try Diflucan again. Also considering contact dermatitis or allergy to Augmentin as rash correlates onset of medication. Patient and I jointly decided that she would try Diflucan first with 2 doses and if no relief, she would start prednisone, Zyrtec, Pepcid AC for contact dermatitis/ allergic reaction. She will let me know if not better.

## 2016-12-30 NOTE — Assessment & Plan Note (Signed)
Rechecking today after 50,000 units Q weekly

## 2017-01-07 ENCOUNTER — Encounter: Payer: Self-pay | Admitting: Family

## 2017-01-08 ENCOUNTER — Other Ambulatory Visit: Payer: Self-pay | Admitting: Family

## 2017-01-08 DIAGNOSIS — B372 Candidiasis of skin and nail: Secondary | ICD-10-CM

## 2017-01-08 MED ORDER — NYSTATIN 100000 UNIT/GM EX POWD: Freq: Three times a day (TID) | CUTANEOUS | 1 refills | Status: DC

## 2017-01-21 ENCOUNTER — Ambulatory Visit: Payer: BLUE CROSS/BLUE SHIELD | Admitting: Family

## 2017-04-20 ENCOUNTER — Ambulatory Visit: Payer: BLUE CROSS/BLUE SHIELD | Admitting: Family

## 2017-07-28 DIAGNOSIS — Z833 Family history of diabetes mellitus: Secondary | ICD-10-CM | POA: Diagnosis not present

## 2017-07-28 DIAGNOSIS — Z6841 Body Mass Index (BMI) 40.0 and over, adult: Secondary | ICD-10-CM | POA: Diagnosis not present

## 2017-07-28 DIAGNOSIS — I10 Essential (primary) hypertension: Secondary | ICD-10-CM | POA: Diagnosis not present

## 2017-07-28 DIAGNOSIS — E559 Vitamin D deficiency, unspecified: Secondary | ICD-10-CM | POA: Diagnosis not present

## 2017-08-13 DIAGNOSIS — R197 Diarrhea, unspecified: Secondary | ICD-10-CM | POA: Diagnosis not present

## 2017-08-14 DIAGNOSIS — R197 Diarrhea, unspecified: Secondary | ICD-10-CM | POA: Diagnosis not present

## 2017-08-20 DIAGNOSIS — J0101 Acute recurrent maxillary sinusitis: Secondary | ICD-10-CM | POA: Diagnosis not present

## 2017-08-21 ENCOUNTER — Encounter: Payer: Self-pay | Admitting: Internal Medicine

## 2017-09-22 ENCOUNTER — Other Ambulatory Visit: Payer: Self-pay | Admitting: Family

## 2017-09-22 DIAGNOSIS — I10 Essential (primary) hypertension: Secondary | ICD-10-CM

## 2017-10-05 DIAGNOSIS — R509 Fever, unspecified: Secondary | ICD-10-CM | POA: Diagnosis not present

## 2017-10-05 DIAGNOSIS — B349 Viral infection, unspecified: Secondary | ICD-10-CM | POA: Diagnosis not present

## 2017-10-20 ENCOUNTER — Ambulatory Visit: Payer: BLUE CROSS/BLUE SHIELD | Admitting: Nurse Practitioner

## 2017-11-13 DIAGNOSIS — I10 Essential (primary) hypertension: Secondary | ICD-10-CM | POA: Diagnosis not present

## 2017-11-13 DIAGNOSIS — E559 Vitamin D deficiency, unspecified: Secondary | ICD-10-CM | POA: Diagnosis not present

## 2017-11-13 DIAGNOSIS — R197 Diarrhea, unspecified: Secondary | ICD-10-CM | POA: Diagnosis not present

## 2017-11-13 DIAGNOSIS — F418 Other specified anxiety disorders: Secondary | ICD-10-CM | POA: Diagnosis not present

## 2017-12-23 ENCOUNTER — Ambulatory Visit (INDEPENDENT_AMBULATORY_CARE_PROVIDER_SITE_OTHER): Payer: BLUE CROSS/BLUE SHIELD | Admitting: Nurse Practitioner

## 2017-12-23 ENCOUNTER — Encounter: Payer: Self-pay | Admitting: Nurse Practitioner

## 2017-12-23 DIAGNOSIS — K591 Functional diarrhea: Secondary | ICD-10-CM | POA: Diagnosis not present

## 2017-12-23 DIAGNOSIS — R197 Diarrhea, unspecified: Secondary | ICD-10-CM | POA: Insufficient documentation

## 2017-12-23 DIAGNOSIS — R1030 Lower abdominal pain, unspecified: Secondary | ICD-10-CM

## 2017-12-23 DIAGNOSIS — R109 Unspecified abdominal pain: Secondary | ICD-10-CM | POA: Insufficient documentation

## 2017-12-23 NOTE — Progress Notes (Signed)
cc'd to pcp 

## 2017-12-23 NOTE — Assessment & Plan Note (Signed)
Lower abdominal crampy pain associated with her diarrhea up to 5+ stools a day.  As per above, this lends itself to a diagnosis of irritable bowel syndrome diarrhea type.  I will check a GI pathogen panel to ensure no occult infection.  Bentyl if her pathogen panel is negative.  Not a candidate for Viberzi.  Follow-up in 3 months.

## 2017-12-23 NOTE — Progress Notes (Signed)
Primary Care Physician:  Zhou-Talbert, Ralene Bathe, MD Primary Gastroenterologist:  Dr. Jena Gauss  Chief Complaint  Patient presents with  . Diarrhea    3-4 times per day, started more once gallbladder removed in Sept 2017    HPI:   Tiffany Jackson is a 37 y.o. female who presents on referral from primary care for evaluation of chronic diarrhea.  Review documentation provided with referral including office visit dated 08/14/2017.  At that time she complained of abdominal pain and diarrhea for 2 weeks, states this comes and goes ever since her cholecystectomy 2 years prior.  She was started on cholestyramine by primary care.  Noted recurrent episodes of persistent, foul-smelling large volume mucousy diarrhea up to 8 a day after every meal.  Sclerae bile acid diarrhea, infectious etiology acute on chronic, IBS/functional diarrhea.  Ordered CBC, CMP, lipase, TSH, stool for parasites, stool for culture, trial cholestyramine.  ER precautions were given.  Labs reviewed that were provided with the referral including Salmonella and Shigella culture, E. coli Shiga toxin, Campylobacter culture.  All listed as "see note" without definitive results.  CMP essentially normal, CBC essentially normal with hemoglobin of 14.1, white blood cell count 9.4, platelets 316.  Lipase low at 6, TSH normal, O&P culture also listed as "see note" without result.  Today she states she's doing ok overall. Had a history of chronic diarrhea for years. Significant worsening after cholecystectomy in 2016. States stool studies were normal. Will have diarrhea daily since cholecystectomy, worse in the morning; typically 5+ stools a day. Has previously seen blood but has known hemorrhoids (montiored by PCP). Has significant lower abdominal cramping when she needs to have a bowel movement, pain stops after bowel movement. Denies fever, chills, unintentional weight loss, melena. Only travel was to [facility] to the beach. Denies sick contacts. States  "I'm not digesting certain foods anymore" specifically lettuce and other leafy vegetables. Significant impact to quality of life, cannot eat out anymore. Cholestyramine did not help her symptoms. Denies chest pain, dyspnea, dizziness, lightheadedness, syncope, near syncope. Denies any other upper or lower GI symptoms.  Has a high stress level,m specifically recently. Husband just had spinal surgery, homeschools 3 children.  Past Medical History:  Diagnosis Date  . Allergy    Seasonal  . Chicken pox   . Depression   . Fibromyalgia   . Frequent headaches   . Hypertension   . Migraines     Past Surgical History:  Procedure Laterality Date  . CESAREAN SECTION     2003, 2005, 2009  . CHOLECYSTECTOMY N/A 04/04/2015   Procedure: LAPAROSCOPIC CHOLECYSTECTOMY;  Surgeon: Natale Lay, MD;  Location: [facility] ORS;  Service: General;  Laterality: N/A;    Current Outpatient Medications  Medication Sig Dispense Refill  . albuterol (PROVENTIL HFA;VENTOLIN HFA) 108 (90 BASE) MCG/ACT inhaler Inhale 2 puffs into the lungs as needed for wheezing or shortness of breath. 1 Inhaler 1  . ALPRAZolam (XANAX) 0.25 MG tablet TAKE 1 TABLET BY MOUTH TWICE DAILY AS NEEDED FOR ANXIETY 60 tablet 0  . amLODipine (NORVASC) 5 MG tablet TAKE 1 TABLET DAILY 90 tablet 3  . B Complex Vitamins (VITAMIN B-COMPLEX PO) Take by mouth daily.    Marland Kitchen gabapentin (NEURONTIN) 300 MG capsule Take 1 capsule by mouth 2 (two) times daily.    . hydrocortisone (ANUSOL-HC) 2.5 % rectal cream Place 1 application rectally 2 (two) times daily. 30 g 1  . Misc Natural Products (TART CHERRY ADVANCED PO) Take by mouth daily.    Marland Kitchen  Omega-3 Fatty Acids (FISH OIL PO) Take by mouth daily.    Marland Kitchen venlafaxine XR (EFFEXOR-XR) 75 MG 24 hr capsule TAKE 1 CAPSULE DAILY WITH BREAKFAST 90 capsule 3  . Vitamin D, Cholecalciferol, 1000 UNITS TABS Take by mouth daily.     No current facility-administered medications for this visit.     Allergies as of 12/23/2017 -  Review Complete 12/23/2017  Allergen Reaction Noted  . Augmentin [amoxicillin-pot clavulanate]  12/30/2016    Family History  Problem Relation Age of Onset  . Multiple sclerosis Mother   . Hypertension Father   . Cancer Maternal Grandmother 50       Breast Cancer  . Cancer Paternal Grandfather        Lung Cancer  . Anuerysm Paternal Grandmother   . Heart disease Daughter   . Colon cancer Neg Hx   . Gastric cancer Neg Hx   . Esophageal cancer Neg Hx   . Crohn's disease Neg Hx   . Ulcerative colitis Neg Hx     Social History   Socioeconomic History  . Marital status: Married    Spouse name: Not on file  . Number of children: Not on file  . Years of education: Not on file  . Highest education level: Not on file  Occupational History  . Not on file  Social Needs  . Financial resource strain: Not on file  . Food insecurity:    Worry: Not on file    Inability: Not on file  . Transportation needs:    Medical: Not on file    Non-medical: Not on file  Tobacco Use  . Smoking status: Current Every Day Smoker    Packs/day: 1.00    Types: Cigarettes  . Smokeless tobacco: Never Used  Substance and Sexual Activity  . Alcohol use: No    Alcohol/week: 0.0 oz  . Drug use: No  . Sexual activity: Yes    Partners: Male    Birth control/protection: Surgical    Comment: Husband  Lifestyle  . Physical activity:    Days per week: Not on file    Minutes per session: Not on file  . Stress: Not on file  Relationships  . Social connections:    Talks on phone: Not on file    Gets together: Not on file    Attends religious service: Not on file    Active member of club or organization: Not on file    Attends meetings of clubs or organizations: Not on file    Relationship status: Not on file  . Intimate partner violence:    Fear of current or ex partner: Not on file    Emotionally abused: Not on file    Physically abused: Not on file    Forced sexual activity: Not on file  Other  Topics Concern  . 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 ENT: Negative for hoarseness, difficulty swallowing. CV: Negative for chest pain, angina, palpitations, peripheral edema.  Respiratory: Negative for dyspnea at rest, cough, sputum, wheezing.  GI: See history of present illness. MS: Negative for joint pain, low back pain.  Derm: Negative for rash or itching.  Endo: Negative for unusual weight change.  Heme:  Negative for bruising or bleeding. Allergy: Negative for rash or hives.    Physical Exam: BP (!) 143/94   Pulse 96   Temp (!) 97.2 F (36.2 C) (Oral)   Ht 5\' 3"  (1.6 m)   Wt 233 lb 12.8 oz (106.1 kg)   LMP 12/09/2017 (Approximate)   BMI 41.42 kg/m  General:   Alert and oriented. Pleasant and cooperative. Well-nourished and well-developed.  Eyes:  Without icterus, sclera clear and conjunctiva pink.  Ears:  Normal auditory acuity. Cardiovascular:  S1, S2 present without murmurs appreciated. Extremities without clubbing or edema. Respiratory:  Clear to auscultation bilaterally. No wheezes, rales, or rhonchi. No distress.  Gastrointestinal:  +BS, soft, and non-distended. Minimal lower abdominal TTP. No HSM noted. No guarding or rebound. No masses appreciated.  Rectal:  Deferred  Musculoskalatal:  Symmetrical without gross deformities. Neurologic:  Alert and oriented x4;  grossly normal neurologically. Psych:  Alert and cooperative. Normal mood and affect. Heme/Lymph/Immune: No excessive bruising noted.    12/23/2017 8:49 AM   Disclaimer: This note was dictated with voice recognition software. Similar sounding words can inadvertently be transcribed and may not be corrected upon review.

## 2017-12-23 NOTE — Patient Instructions (Signed)
1. Have your stool test completed as soon as you can. 2. We will call you with the results. 3. We will start you on a medicine called Bentyl if your GI pathogen panel is negative.  If we do, please call us 1 to 2 weeks later and let us know if it is helping.  We can make dose adjustments and change to other medicines over the phone in between visits. 4. Follow-up in 3 months. 5. Call us if you have any questions or concerns.   It was great meeting you today!  I hope you have a wonderful summer and a great beach trip!!!    At [facility] we value your feedback. You may receive a survey about your visit today. Please share your experience as we strive to create trusting relationships with our patients to provide genuine, compassionate, quality care.

## 2017-12-23 NOTE — Assessment & Plan Note (Signed)
Patient has significant diarrhea.  This was happening before her cholecystectomy but became worse afterward.  Her diarrhea is often 5 times a day, postprandial.  It is having an impact on her life.  She has associated lower abdominal cramping which resolves after bowel movement.  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  Her parasite specific stool studies were found to be negative, per the patient.  I will check a GI pathogen panel to ensure there is no other occult infection.  Likely differentials include irritable bowel syndrome which is high on her list, less likely inflammatory bowel disease, celiac disease, occult infection.  She could have multifactorial diarrhea with bile acid salt diarrhea in addition to IBS.  If her GI pathogen panel is negative I will start her on Bentyl.  She is not a candidate for Viberzi because she is postcholecystectomy.  Follow-up in 3 months.

## 2017-12-24 LAB — GASTROINTESTINAL PATHOGEN PANEL PCR
C. difficile Tox A/B, PCR: NOT DETECTED
CAMPYLOBACTER, PCR: NOT DETECTED
Cryptosporidium, PCR: NOT DETECTED
E COLI (STEC) STX1/STX2, PCR: NOT DETECTED
E COLI 0157, PCR: NOT DETECTED
E coli (ETEC) LT/ST PCR: NOT DETECTED
Giardia lamblia, PCR: NOT DETECTED
Norovirus, PCR: NOT DETECTED
Rotavirus A, PCR: NOT DETECTED
SALMONELLA, PCR: NOT DETECTED
Shigella, PCR: NOT DETECTED

## 2017-12-30 ENCOUNTER — Other Ambulatory Visit: Payer: Self-pay | Admitting: Nurse Practitioner

## 2017-12-30 DIAGNOSIS — R109 Unspecified abdominal pain: Secondary | ICD-10-CM

## 2017-12-30 DIAGNOSIS — R197 Diarrhea, unspecified: Secondary | ICD-10-CM

## 2017-12-30 MED ORDER — DICYCLOMINE HCL 10 MG PO CAPS: 10.0000 mg | ORAL_CAPSULE | Freq: Four times a day (QID) | ORAL | 2 refills | Status: DC | PRN

## 2017-12-30 NOTE — Progress Notes (Signed)
Rx sent to pharmacy per result note.  

## 2018-01-27 ENCOUNTER — Encounter: Payer: Self-pay | Admitting: Nurse Practitioner

## 2018-02-04 DIAGNOSIS — I10 Essential (primary) hypertension: Secondary | ICD-10-CM | POA: Diagnosis not present

## 2018-02-04 DIAGNOSIS — F418 Other specified anxiety disorders: Secondary | ICD-10-CM | POA: Diagnosis not present

## 2018-02-04 DIAGNOSIS — M797 Fibromyalgia: Secondary | ICD-10-CM | POA: Diagnosis not present

## 2018-02-04 DIAGNOSIS — E559 Vitamin D deficiency, unspecified: Secondary | ICD-10-CM | POA: Diagnosis not present

## 2018-02-08 ENCOUNTER — Emergency Department (HOSPITAL_COMMUNITY)
Admission: EM | Admit: 2018-02-08 | Discharge: 2018-02-08 | Disposition: A | Discharge status: Home / Self Care | Payer: BLUE CROSS/BLUE SHIELD | Attending: Emergency Medicine | Admitting: Emergency Medicine

## 2018-02-08 ENCOUNTER — Emergency Department (HOSPITAL_COMMUNITY): Payer: BLUE CROSS/BLUE SHIELD

## 2018-02-08 ENCOUNTER — Encounter (HOSPITAL_COMMUNITY): Payer: Self-pay | Admitting: Emergency Medicine

## 2018-02-08 ENCOUNTER — Other Ambulatory Visit: Payer: Self-pay

## 2018-02-08 DIAGNOSIS — R0789 Other chest pain: Secondary | ICD-10-CM | POA: Diagnosis not present

## 2018-02-08 DIAGNOSIS — R079 Chest pain, unspecified: Secondary | ICD-10-CM | POA: Diagnosis not present

## 2018-02-08 DIAGNOSIS — Z79899 Other long term (current) drug therapy: Secondary | ICD-10-CM | POA: Diagnosis not present

## 2018-02-08 DIAGNOSIS — F17210 Nicotine dependence, cigarettes, uncomplicated: Secondary | ICD-10-CM | POA: Diagnosis not present

## 2018-02-08 DIAGNOSIS — I10 Essential (primary) hypertension: Secondary | ICD-10-CM | POA: Insufficient documentation

## 2018-02-08 LAB — BASIC METABOLIC PANEL
Anion gap: 7 (ref 5–15)
BUN: 7 mg/dL (ref 6–20)
CHLORIDE: 106 mmol/L (ref 101–111)
CO2: 27 mmol/L (ref 22–32)
Calcium: 8.8 mg/dL — ABNORMAL LOW (ref 8.9–10.3)
Creatinine, Ser: 0.62 mg/dL (ref 0.44–1.00)
GFR calc non Af Amer: >60 mL/min (ref >60)
GLUCOSE: 104 mg/dL — AB (ref 65–99)
POTASSIUM: 3.7 mmol/L (ref 3.5–5.1)
Sodium: 140 mmol/L (ref 135–145)

## 2018-02-08 LAB — CBC
HEMATOCRIT: 42.4 % (ref 36.0–46.0)
Hemoglobin: 14.1 g/dL (ref 12.0–15.0)
MCH: 30.2 pg (ref 26.0–34.0)
MCHC: 33.3 g/dL (ref 30.0–36.0)
MCV: 90.8 fL (ref 78.0–100.0)
Platelets: 284 10*3/uL (ref 150–400)
RBC: 4.67 MIL/uL (ref 3.87–5.11)
RDW: 13 % (ref 11.5–15.5)
WBC: 9 10*3/uL (ref 4.0–10.5)

## 2018-02-08 LAB — HCG, QUANTITATIVE, PREGNANCY: hCG, Beta Chain, Quant, S: <1 m[IU]/mL (ref <5)

## 2018-02-08 LAB — TROPONIN I: Troponin I: <0.03 ng/mL (ref <0.03)

## 2018-02-08 MED ORDER — KETOROLAC TROMETHAMINE 30 MG/ML IJ SOLN
15.0000 mg | Freq: Once | INTRAMUSCULAR | Status: AC
  Administered 2018-02-08: 15 mg via INTRAVENOUS
  Filled 2018-02-08: qty 1

## 2018-02-08 MED ORDER — RANITIDINE HCL 150 MG PO CAPS: 150.0000 mg | ORAL_CAPSULE | Freq: Every day | ORAL | 0 refills | Status: DC

## 2018-02-08 MED ORDER — IBUPROFEN 400 MG PO TABS: 400.0000 mg | ORAL_TABLET | Freq: Three times a day (TID) | ORAL | 0 refills | Status: AC

## 2018-02-08 NOTE — Discharge Instructions (Signed)
As discussed, your evaluation today has been largely reassuring.  But, it is important that you monitor your condition carefully, and do not hesitate to return to the ED if you develop new, or concerning changes in your condition. ? ?Otherwise, please follow-up with your physician for appropriate ongoing care. ? ?

## 2018-02-08 NOTE — ED Triage Notes (Signed)
Patient complaining of chest pain radiating into bilateral shoulder and upper back starting today.

## 2018-02-08 NOTE — ED Provider Notes (Signed)
[facility] EMERGENCY DEPARTMENT Provider Note   CSN: [id number] Arrival date & time: 02/08/18  1644     History   Chief Complaint Chief Complaint  Patient presents with  . Chest Pain    HPI Tilden FossaMelissa Gallien is a 37 y.o. female.  HPI Patient presents with concern of chest pain. She notes that since her gallbladder removed was removed about 2 years ago she has had episodes of pain, though not as severe as today's. Since about 3 hours ago she has had severe sharp pain in the sternum. There is some radiation towards the back, with some generalized discomfort, but no syncope, no fever. No relief with OTC antacids. She is here with her husband who assists with the HPI. She has multiple medical issues, including after mentioned cholecystectomy, but is generally well. Past Medical History:  Diagnosis Date  . Allergy    Seasonal  . Chicken pox   . Depression   . Fibromyalgia   . Frequent headaches   . Hypertension   . Migraines     Patient Active Problem List   Diagnosis Date Noted  . Diarrhea 12/23/2017  . Abdominal pain 12/23/2017  . Vitamin D deficiency 12/30/2016  . Candidiasis of genitalia in female 12/30/2016  . Acute non-recurrent maxillary sinusitis 12/12/2016  . Hemorrhoids 10/09/2016  . Fibromyalgia 04/01/2016  . Benign essential HTN 04/13/2015  . Depression with anxiety 03/30/2015  . Migraines 03/30/2015  . Seasonal allergies 03/30/2015    Past Surgical History:  Procedure Laterality Date  . CESAREAN SECTION     2003, 2005, 2009  . CHOLECYSTECTOMY N/A 04/04/2015   Procedure: LAPAROSCOPIC CHOLECYSTECTOMY;  Surgeon: Natale LayMark Bird, MD;  Location: [facility] ORS;  Service: General;  Laterality: N/A;     OB History   None      Home Medications    Prior to Admission medications   Medication Sig Start Date End Date Taking? Authorizing Provider  albuterol (PROVENTIL HFA;VENTOLIN HFA) 108 (90 BASE) MCG/ACT inhaler Inhale 2 puffs into the lungs as needed for wheezing  or shortness of breath. 05/01/15   Carollee Leitzoss, Carrie M, RN  ALPRAZolam Prudy Feeler(XANAX) 0.25 MG tablet TAKE 1 TABLET BY MOUTH TWICE DAILY AS NEEDED FOR ANXIETY 08/23/15   Carollee Leitzoss, Carrie M, RN  amLODipine (NORVASC) 5 MG tablet TAKE 1 TABLET DAILY 09/22/17   Allegra GranaArnett, Margaret G, FNP  B Complex Vitamins (VITAMIN B-COMPLEX PO) Take by mouth daily.    [provider]  dicyclomine (BENTYL) 10 MG capsule Take 1 capsule (10 mg total) by mouth 4 (four) times daily as needed for spasms. 12/30/17   Anice PaganiniGill, Eric A, NP  gabapentin (NEURONTIN) 300 MG capsule Take 1 capsule by mouth 2 (two) times daily. 11/13/17   [provider]  hydrocortisone (ANUSOL-HC) 2.5 % rectal cream Place 1 application rectally 2 (two) times daily. 10/09/16   Allegra GranaArnett, Margaret G, FNP  Misc Natural Products (TART CHERRY ADVANCED PO) Take by mouth daily.    [provider]  Omega-3 Fatty Acids (FISH OIL PO) Take by mouth daily.    [provider]  venlafaxine XR (EFFEXOR-XR) 75 MG 24 hr capsule TAKE 1 CAPSULE DAILY WITH BREAKFAST 09/22/17   Allegra GranaArnett, Margaret G, FNP  Vitamin D, Cholecalciferol, 1000 UNITS TABS Take by mouth daily.    [provider]    Family History Family History  Problem Relation Age of Onset  . Multiple sclerosis Mother   . Hypertension Father   . Cancer Maternal Grandmother 3550  Breast Cancer  . Cancer Paternal Grandfather        Lung Cancer  . Anuerysm Paternal Grandmother   . Heart disease Daughter   . Colon cancer Neg Hx   . Gastric cancer Neg Hx   . Esophageal cancer Neg Hx   . Crohn's disease Neg Hx   . Ulcerative colitis Neg Hx     Social History Social History   Tobacco Use  . Smoking status: Current Every Day Smoker    Packs/day: 1.00    Types: Cigarettes  . Smokeless tobacco: Never Used  Substance Use Topics  . Alcohol use: No    Alcohol/week: 0.0 oz  . Drug use: No     Allergies   Augmentin [amoxicillin-pot clavulanate]   Review of Systems Review of  Systems  Constitutional:       Per HPI, otherwise negative  HENT:       Per HPI, otherwise negative  Respiratory:       Per HPI, otherwise negative  Cardiovascular:       Per HPI, otherwise negative  Gastrointestinal: Negative for vomiting.  Endocrine:       Negative aside from HPI  Genitourinary:       Neg aside from HPI   Musculoskeletal:       Per HPI, otherwise negative  Skin: Negative.   Neurological: Negative for syncope.     Physical Exam Updated Vital Signs BP (!) 151/102 (BP Location: Right Arm)   Pulse (!) 110   Temp 98.6 F (37 C) (Oral)   Resp 18   Ht 5\' 3"  (1.6 m)   Wt 107 kg (236 lb)   LMP 01/25/2018   SpO2 99%   BMI 41.81 kg/m   Physical Exam  Constitutional: She is oriented to person, place, and time. She appears well-developed and well-nourished. No distress.  HENT:  Head: Normocephalic and atraumatic.  Eyes: Conjunctivae and EOM are normal.  Cardiovascular: Normal rate and regular rhythm.  Pulmonary/Chest: Effort normal and breath sounds normal. No stridor. No respiratory distress.  Abdominal: She exhibits no distension.    Musculoskeletal: She exhibits no edema.  Neurological: She is alert and oriented to person, place, and time. No cranial nerve deficit.  Skin: Skin is warm and dry.  Psychiatric: She has a normal mood and affect.  Nursing note and vitals reviewed.    ED Treatments / Results  Labs (all labs ordered are listed, but only abnormal results are displayed) Labs Reviewed  BASIC METABOLIC PANEL - Abnormal; Notable for the following components:      Result Value   Glucose, Bld 104 (*)    Calcium 8.8 (*)    All other components within normal limits  CBC  TROPONIN I  HCG, QUANTITATIVE, PREGNANCY    EKG EKG Interpretation  Date/Time:  Monday February 08 2018 16:53:46 EDT Ventricular Rate:  111 PR Interval:  150 QRS Duration: 74 QT Interval:  330 QTC Calculation: 448 R Axis:   73 Text Interpretation:  Sinus tachycardia  Nonspecific ST abnormality Abnormal ECG Abnormal ekg Confirmed by Gerhard Munch [phone]) on 02/08/2018 7:27:08 PM   Radiology Dg Chest 2 View  Result Date: 02/08/2018 CLINICAL DATA:  Chest pain EXAM: CHEST - 2 VIEW COMPARISON:  03/31/2015 FINDINGS: The lungs are clear without focal pneumonia, edema, pneumothorax or pleural effusion. The cardiopericardial silhouette is within normal limits for size. The visualized bony structures of the thorax are intact. IMPRESSION: No active cardiopulmonary disease. Electronically Signed   By: Minerva Areola  Molli Posey M.D.   On: 02/08/2018 17:32    Procedures Procedures (including critical care time)  Medications Ordered in ED Medications  ketorolac (TORADOL) 30 MG/ML injection 15 mg (15 mg Intravenous Given 02/08/18 1929)     Initial Impression / Assessment and Plan / ED Course  I have reviewed the triage vital signs and the nursing notes.  Pertinent labs & imaging results that were available during my care of the patient were reviewed by me and considered in my medical decision making (see chart for details).     7:59 PM On repeat exam the patient is awake, alert, in no distress per We discussed all findings. This young female presents with reproducible chest pain. Patient has a history of GERD, but notes that this is atypical, and there is suspicion for coronary disease, but with reproducibility, nonischemic EKG, reassuring labs, ongoing coronary ischemia is unlikely. Similarly, no evidence for pulmonary disease. Patient had initiation of analgesia here, was discharged with ongoing NSAID therapy, H2 blocker, outpatient follow-up.  Final Clinical Impressions(s) / ED Diagnoses  Atypical chest pain   Gerhard Munch, MD 02/08/18 2000

## 2018-02-10 DIAGNOSIS — M25549 Pain in joints of unspecified hand: Secondary | ICD-10-CM | POA: Diagnosis not present

## 2018-02-10 DIAGNOSIS — K219 Gastro-esophageal reflux disease without esophagitis: Secondary | ICD-10-CM | POA: Diagnosis not present

## 2018-02-10 DIAGNOSIS — R0789 Other chest pain: Secondary | ICD-10-CM | POA: Diagnosis not present

## 2018-03-24 ENCOUNTER — Ambulatory Visit (INDEPENDENT_AMBULATORY_CARE_PROVIDER_SITE_OTHER): Payer: BLUE CROSS/BLUE SHIELD | Admitting: Nurse Practitioner

## 2018-03-24 ENCOUNTER — Encounter: Payer: Self-pay | Admitting: Nurse Practitioner

## 2018-03-24 ENCOUNTER — Other Ambulatory Visit: Payer: Self-pay

## 2018-03-24 VITALS — BP 145/95 | HR 91 | Temp 97.1°F | Ht 63.0 in | Wt 238.2 lb

## 2018-03-24 DIAGNOSIS — R197 Diarrhea, unspecified: Secondary | ICD-10-CM

## 2018-03-24 DIAGNOSIS — K591 Functional diarrhea: Secondary | ICD-10-CM

## 2018-03-24 DIAGNOSIS — R1013 Epigastric pain: Secondary | ICD-10-CM | POA: Diagnosis not present

## 2018-03-24 MED ORDER — CLENPIQ 10-3.5-12 MG-GM -GM/160ML PO SOLN: 1.0000 | Freq: Once | ORAL | 0 refills | Status: AC

## 2018-03-24 MED ORDER — DIPHENOXYLATE-ATROPINE 2.5-0.025 MG PO TABS
1.0000 | ORAL_TABLET | Freq: Four times a day (QID) | ORAL | 0 refills | Status: DC | PRN
Start: 2018-03-24 — End: 2018-12-29

## 2018-03-24 NOTE — Patient Instructions (Addendum)
1. Make your best effort to take your acid blocker once a day, every day. 2. I will send in a prescription of Lomotil to your pharmacy.  You can take this up to 4 times a day, if needed for worsening diarrhea. 3. Call us and let us know if it helps. 4. We will schedule your colonoscopy for you. 5. Return for follow-up in 3 months. 6. Call us if you have any questions or concerns.  At [facility] we value your feedback. You may receive a survey about your visit today. Please share your experience as we strive to create trusting relationships with our patients to provide genuine, compassionate, quality care.  It was great to see you today!  I hope you have a great summer!!

## 2018-03-24 NOTE — Assessment & Plan Note (Signed)
Notes epigastric pain.  She is mildly tender on palpation during exam today.  She is previously been evaluated for chest pain by her primary care, emergency department, cardiology.  They feel it is GI in nature.  She has had some improvement with changing Zantac to Prilosec.  However, she has difficulty remembering to take her Prilosec.  I have offered options to help her remember.  I do not feel it is worth increasing her dose at this time if she cannot remember to take it once a day.  She does have worsening pain if she eats dairy or eats too much.  She has made lifestyle changes to eating smaller meals to help with her pain.  However, she states that she really likes cheese and it is difficult to avoid dairy.  At this point I have encouraged her to try to take her PPI regularly.  Further work-up of diarrhea as per above.  Return for follow-up in 3 months.  If she is more compliant with her PPI at that time we can evaluate for effectiveness and consider other options if needed.

## 2018-03-24 NOTE — Patient Instructions (Signed)
Called [employer], no PA needed for TCS. Ref# [id number].

## 2018-03-24 NOTE — Assessment & Plan Note (Signed)
Chronic diarrhea with negative GI pathogen panel.  She tried and failed Bentyl.  She is tried and failed cholestyramine.  Her diarrhea is somewhat improved but still persistent with 2-3 stools a day.  Lactose may play a part, although more so with her abdominal pain, but she states that she loves to eat cheese and lactose-free diet is difficult for her.  Given her chronic diarrhea that is not resolved despite outpatient treatment we will proceed with a colonoscopy to further evaluate.  Follow-up in 3 months.  Proceed with TCS on propofol/MAC with Dr. Gala Romney in near future: the risks, benefits, and alternatives have been discussed with the patient in detail. The patient states understanding and desires to proceed.  The patient is currently on Xanax, Neurontin, Effexor.  No other anticoagulants, anxiolytics, chronic pain medications, or antidepressants.  We will proceed on propofol/MAC to promote adequate sedation.

## 2018-03-24 NOTE — Progress Notes (Signed)
CC'D TO PCP °

## 2018-03-24 NOTE — Progress Notes (Signed)
Referring Provider: Tanna Furry Primary Care Physician:  Tanna Furry, MD Primary GI:  Dr. Jena Gauss  Chief Complaint  Patient presents with  . Diarrhea    2-3 times per day depending on what she eats  . Abdominal Pain    upper center abd, comes/goes depending on what she eats or if she moves wrong    HPI:   Tiffany Jackson is a 37 y.o. female who presents for follow-up on diarrhea and abdominal pain.  The patient was last seen in our office 12/23/2017 for functional diarrhea and lower abdominal pain.  Noted history of chronic diarrhea.  She has had this since her cholecystectomy 2 years prior.  Despite cholestyramine she noted persistent foul-smelling large-volume mucousy diarrhea up to 8 times a day.  Stool studies were completed with no definitive results reported.  Normal CBC, white count.  Lipase low at 6.  At her last visit she was doing okay, chronic diarrhea for years which worsened after cholecystectomy.  She states her stool studies were normal.  Notes daily diarrhea 5+ stools a day, worse in the morning.  Lower abdominal cramping when she needs to have a bowel movement and this stops after her bowel movement.  Notes that she is "not digesting certain foods anymore" such as lettuce and other leafy vegetables.  Significant impact her life.  High stress level, specifically recently. Recommended GI path panel, start Bentyl if path panel negative, follow-up in 3 months.  GI pathogen panel completed 12/23/2017 which was normal.  Recommended Bentyl 10 mg up to 4 times a day as needed and request progress report.  She called 01/27/2018 to state no change with Bentyl but she has noticed that cheese causes significant pain and questions lactose issue.  Recommended she try dairy free diet to see how this helps her symptoms.  Today she states she's doing ok overall. Eliminating dairy did help but it's hard to do because she loves cheese. Diarrhea is somewhat improved, has about  2-3 stools a day (previously 5-10 a day). Has made dietary changes. Consistent with [place] 4-5 stools. Bentyl did not help. Denies hematochezia, melena, unintentional weight loss, fever, chills. Has abdominal pain mid to upper/epigastric area. Has a hiatal hernia. Was in the hospital about a month ago due to abdominal pain. She was told she had a hiatal hernia and was previously treated with anti-reflux medications. Change from Zantac to omeprazole has helped the pain. Forgets to take it at times. She is adamate she doesn't have a standard routine to help her place her medications to help her remember. Denies N/V. Has intermittent "chest pain" and has seen the ER, PCP, Cardiology. Denies dyspnea, dizziness, lightheadedness, syncope, near syncope. Denies any other upper or lower GI symptoms.    Past Medical History:  Diagnosis Date  . Allergy    Seasonal  . Chicken pox   . Depression   . Fibromyalgia   . Frequent headaches   . Hypertension   . Migraines     Past Surgical History:  Procedure Laterality Date  . CESAREAN SECTION     2003, 2005, 2009  . CHOLECYSTECTOMY N/A 04/04/2015   Procedure: LAPAROSCOPIC CHOLECYSTECTOMY;  Surgeon: Natale Lay, MD;  Location: [facility] ORS;  Service: General;  Laterality: N/A;    Current Outpatient Medications  Medication Sig Dispense Refill  . albuterol (PROVENTIL HFA;VENTOLIN HFA) 108 (90 BASE) MCG/ACT inhaler Inhale 2 puffs into the lungs as needed for wheezing or shortness of breath. 1 Inhaler 1  .  ALPRAZolam (XANAX) 0.25 MG tablet TAKE 1 TABLET BY MOUTH TWICE DAILY AS NEEDED FOR ANXIETY 60 tablet 0  . amLODipine (NORVASC) 5 MG tablet TAKE 1 TABLET DAILY 90 tablet 3  . B Complex Vitamins (VITAMIN B-COMPLEX PO) Take by mouth daily.    Marland Kitchen. gabapentin (NEURONTIN) 300 MG capsule Take 1 capsule by mouth 2 (two) times daily.    . hydrocortisone (ANUSOL-HC) 2.5 % rectal cream Place 1 application rectally 2 (two) times daily. (Patient taking differently: Place 1  application rectally as needed. ) 30 g 1  . MELATONIN PO Take by mouth at bedtime as needed.    . Misc Natural Products (TART CHERRY ADVANCED PO) Take by mouth daily.    . Omega-3 Fatty Acids (FISH OIL PO) Take by mouth daily.    Marland Kitchen. omeprazole (PRILOSEC) 20 MG capsule Take 20 mg by mouth daily.    Marland Kitchen. venlafaxine XR (EFFEXOR-XR) 75 MG 24 hr capsule TAKE 1 CAPSULE DAILY WITH BREAKFAST 90 capsule 3  . Vitamin D, Cholecalciferol, 1000 UNITS TABS Take by mouth daily.    . diphenoxylate-atropine (LOMOTIL) 2.5-0.025 MG tablet Take 1 tablet by mouth 4 (four) times daily as needed for diarrhea or loose stools. 30 tablet 0   No current facility-administered medications for this visit.     Allergies as of 03/24/2018 - Review Complete 03/24/2018  Allergen Reaction Noted  . Augmentin [amoxicillin-pot clavulanate]  12/30/2016    Family History  Problem Relation Age of Onset  . Multiple sclerosis Mother   . Hypertension Father   . Cancer Maternal Grandmother 50       Breast Cancer  . Cancer Paternal Grandfather        Lung Cancer  . Anuerysm Paternal Grandmother   . Heart disease Daughter   . Colon cancer Neg Hx   . Gastric cancer Neg Hx   . Esophageal cancer Neg Hx   . Crohn's disease Neg Hx   . Ulcerative colitis Neg Hx     Social History   Socioeconomic History  . Marital status: Married    Spouse name: Not on file  . Number of children: Not on file  . Years of education: Not on file  . Highest education level: Not on file  Occupational History  . Not on file  Social Needs  . Financial resource strain: Not on file  . Food insecurity:    Worry: Not on file    Inability: Not on file  . Transportation needs:    Medical: Not on file    Non-medical: Not on file  Tobacco Use  . Smoking status: Former Smoker    Packs/day: 1.00    Types: Cigarettes    Last attempt to quit: 02/22/2018    Years since quitting: 0.0  . Smokeless tobacco: Never Used  Substance and Sexual Activity  .  Alcohol use: No    Alcohol/week: 0.0 oz  . Drug use: No  . Sexual activity: Yes    Partners: Male    Birth control/protection: Surgical    Comment: Husband  Lifestyle  . Physical activity:    Days per week: Not on file    Minutes per session: Not on file  . Stress: Not on file  Relationships  . Social connections:    Talks on phone: Not on file    Gets together: Not on file    Attends religious service: Not on file    Active member of club or organization: Not on file  Attends meetings of clubs or organizations: Not on file    Relationship status: Not on file  Other Topics Concern  . 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 ENT: Negative for hoarseness, difficulty swallowing , nasal congestion. CV: Negative for chest pain, angina, palpitations, dyspnea on exertion, peripheral edema.  Respiratory: Negative for dyspnea at rest, dyspnea on exertion, cough, sputum, wheezing.  GI: See history of present illness. Endo: Negative for unusual weight change.  Heme: Negative for bruising or bleeding.   Physical Exam: BP (!) 145/95   Pulse 91   Temp (!) 97.1 F (36.2 C) (Oral)   Ht 5\' 3"  (1.6 m)   Wt 238 lb 3.2 oz (108 kg)   LMP 03/19/2018   BMI 42.20 kg/m  General:   Alert and oriented. Pleasant and cooperative. Well-nourished and well-developed.  Eyes:  Without icterus, sclera clear and conjunctiva pink.  Ears:  Normal auditory acuity. Cardiovascular:  S1, S2 present without murmurs appreciated. Extremities without clubbing or edema. Respiratory:  Clear to auscultation bilaterally. No wheezes, rales, or rhonchi. No distress.  Gastrointestinal:  +BS, soft, and non-distended. Mild epigastric TTP noted. No HSM noted. No guarding or  rebound. No masses appreciated.  Rectal:  Deferred  Musculoskalatal:  Symmetrical without gross deformities. Neurologic:  Alert and oriented x4;  grossly normal neurologically. Psych:  Alert and cooperative. Normal mood and affect. Heme/Lymph/Immune: No excessive bruising noted.    03/24/2018 8:39 AM   Disclaimer: This note was dictated with voice recognition software. Similar sounding words can inadvertently be transcribed and may not be corrected upon review.

## 2018-04-05 ENCOUNTER — Telehealth: Payer: Self-pay | Admitting: Internal Medicine

## 2018-04-05 ENCOUNTER — Other Ambulatory Visit: Payer: Self-pay

## 2018-04-05 MED ORDER — PEG 3350-KCL-NA BICARB-NACL 420 G PO SOLR: 4000.0000 mL | ORAL | 0 refills | Status: DC

## 2018-04-05 NOTE — Telephone Encounter (Signed)
Pt called to see if we could call in a different prep for her at [facility].

## 2018-04-05 NOTE — Telephone Encounter (Signed)
Called pt, Clenpiq was $135. Requested cheaper prep. Tri-Lyte rx sent to pharmacy. New instructions mailed.

## 2018-04-20 ENCOUNTER — Telehealth: Payer: Self-pay

## 2018-04-20 NOTE — Telephone Encounter (Signed)
Noted. I'm assuming it's due to deductible.

## 2018-04-20 NOTE — Telephone Encounter (Signed)
Pt called office, she received phone call from pre-service center. She will have to pay approx $1500 for upcoming TCS 04/28/18. She is aware she can make payments and isn't required to pay full amount upfront. States she can't afford it. Her husband recently had spine surgery and returned to work. Requested to cancel TCS. LMOVM for endo scheduler to cancel procedure per pt request.  Routing to EG as FYI.

## 2018-04-28 ENCOUNTER — Encounter (HOSPITAL_COMMUNITY): Admission: RE | Payer: Self-pay | Source: Ambulatory Visit

## 2018-04-28 ENCOUNTER — Ambulatory Visit (HOSPITAL_COMMUNITY)
Admission: RE | Admit: 2018-04-28 | Payer: BLUE CROSS/BLUE SHIELD | Source: Ambulatory Visit | Admitting: Internal Medicine

## 2018-04-28 SURGERY — COLONOSCOPY
Anesthesia: Moderate Sedation

## 2018-04-30 DIAGNOSIS — J0141 Acute recurrent pansinusitis: Secondary | ICD-10-CM | POA: Diagnosis not present

## 2018-06-17 DIAGNOSIS — M797 Fibromyalgia: Secondary | ICD-10-CM | POA: Diagnosis not present

## 2018-06-17 DIAGNOSIS — Z131 Encounter for screening for diabetes mellitus: Secondary | ICD-10-CM | POA: Diagnosis not present

## 2018-06-17 DIAGNOSIS — K625 Hemorrhage of anus and rectum: Secondary | ICD-10-CM | POA: Diagnosis not present

## 2018-06-17 DIAGNOSIS — E559 Vitamin D deficiency, unspecified: Secondary | ICD-10-CM | POA: Diagnosis not present

## 2018-06-17 DIAGNOSIS — I10 Essential (primary) hypertension: Secondary | ICD-10-CM | POA: Diagnosis not present

## 2018-06-17 DIAGNOSIS — N939 Abnormal uterine and vaginal bleeding, unspecified: Secondary | ICD-10-CM | POA: Diagnosis not present

## 2018-06-24 ENCOUNTER — Encounter: Payer: Self-pay | Admitting: *Deleted

## 2018-06-24 ENCOUNTER — Other Ambulatory Visit: Payer: Self-pay | Admitting: *Deleted

## 2018-06-24 ENCOUNTER — Telehealth: Payer: Self-pay | Admitting: *Deleted

## 2018-06-24 ENCOUNTER — Encounter: Payer: Self-pay | Admitting: Nurse Practitioner

## 2018-06-24 ENCOUNTER — Ambulatory Visit (INDEPENDENT_AMBULATORY_CARE_PROVIDER_SITE_OTHER): Payer: BLUE CROSS/BLUE SHIELD | Admitting: Nurse Practitioner

## 2018-06-24 VITALS — BP 132/88 | HR 109 | Temp 97.1°F | Ht 63.0 in | Wt 240.8 lb

## 2018-06-24 DIAGNOSIS — K219 Gastro-esophageal reflux disease without esophagitis: Secondary | ICD-10-CM

## 2018-06-24 DIAGNOSIS — K649 Unspecified hemorrhoids: Secondary | ICD-10-CM | POA: Diagnosis not present

## 2018-06-24 DIAGNOSIS — K625 Hemorrhage of anus and rectum: Secondary | ICD-10-CM | POA: Diagnosis not present

## 2018-06-24 DIAGNOSIS — R1013 Epigastric pain: Secondary | ICD-10-CM

## 2018-06-24 MED ORDER — HYDROCORTISONE 2.5 % RE CREA: 1.0000 "application " | TOPICAL_CREAM | Freq: Two times a day (BID) | RECTAL | 1 refills | Status: DC

## 2018-06-24 NOTE — Assessment & Plan Note (Addendum)
The patient has had what she described as "moderate amount" rectal bleeding for about for 5 days last week.  This is associated with abdominal cramping and her known chronic loose stools.  No history of inflammatory bowel disease or colon cancer in her family.  She was unable to have a colonoscopy at last time due to the cost but she indicates she can proceed at this point.  She does have a known hemorrhoid as per above.  I will send Anusol rectal cream for hemorrhoid.  We will move forward with colonoscopy to further evaluate her rectal bleeding.  Continue other medications, follow-up in 4 months.  Proceed with TCS on propofol/MAC with Dr. Gala Romney in near future: the risks, benefits, and alternatives have been discussed with the patient in detail. The patient states understanding and desires to proceed.  The patient has insomnia.  She is currently on Xanax, Neurontin, Effexor.  No other anticoagulants, anxiolytics, chronic pain medications, or antidepressants.  We will plan for the procedure on propofol/MAC to promote adequate sedation.

## 2018-06-24 NOTE — Patient Instructions (Signed)
1. Continue your current medications. 2. I have sent in a prescription for Anusol rectal cream.  Apply this 1-2 times a day up to 10 days at a time for hemorrhoid flares. 3. We will schedule your colonoscopy for you. 4. Return for follow-up in 4 months. 5. Call us if you have any questions or concerns.  At [facility] we value your feedback. You may receive a survey about your visit today. Please share your experience as we strive to create trusting relationships with our patients to provide genuine, compassionate, quality care.  We appreciate your understanding and patience as we review any laboratory studies, imaging, and other diagnostic tests that are ordered as we care for you. Our office policy is 5 business days for review of these results, and any emergent or urgent results are addressed in a timely manner for your best interest. If you do not hear from our office in 1 week, please contact us.   We also encourage the use of MyChart, which contains your medical information for your review as well. If you are not enrolled in this feature, an access code is on this after visit summary for your convenience. Thank you for allowing [place] to be involved in your care.  It was great to meet you today!  I hope you have a great Fall!!

## 2018-06-24 NOTE — Progress Notes (Signed)
  Referring Provider: Zhou-Talbert, Serena S,* Primary Care Physician:  Zhou-Talbert, Serena S, MD Primary GI:  Dr. Rourk  Chief Complaint  Patient presents with  . Diarrhea  . Rectal Bleeding    bright red; last week for approx 4 days  . Abdominal Pain    HPI:   Tiffany Jackson is a 37 y.o. female who presents for follow-up on diarrhea and abdominal pain.  The patient was last seen in our office 03/24/2018 for functional diarrhea and epigastric pain.  Noted history of chronic diarrhea since cholecystectomy 2 years prior.  Despite cholestyramine she noted persistent foul-smelling large-volume mucousy diarrhea up to 8 times a day.  Stool studies were completed and no definitive results reported.  Normal CBC and white count.  Repeat of GI pathogen panel 12/23/2017 was normal.  Recommended Bentyl 10 mg 4 times a day at some point.  Eventually she stated Bentyl did not really help but noted worsening with cheese and queried lactose issue.  At her last visit she states eliminating dairy did help but it is hard to do because she loves cheese.  Diarrhea is somewhat improved down from 8 stools a day to 2-3 stools a day.  Has made dietary changes.  Bentyl did not help.  Known hiatal hernia and hospital admission for abdominal pain treated with antireflux measures.  Change from Zantac to omeprazole has helped.  She notes that she often forgets to take her medications because she does not have a standard routine to help her remember to take her medications.  No other GI symptoms.  Commended take medications as prescribed, try Lomotil for diarrhea control, colonoscopy, follow-up in 3 months.  The patient called our office 04/20/2018 to cancel her colonoscopy because it would cost her $1500.  She also sent a message saying her family was worried she has diverticulitis because it runs in her family.  I explained that the only way to diagnose diverticular disease is with colonoscopy and I doubt active, infected  diverticulitis based on her symptoms although to diagnose diverticulitis who would likely need a CT scan.  However, she felt with sticking to her antireflux measures more closely that her pain was improving.  Today she states she's doing ok overall. She started having rectal bleeding about a week ago on Tuesday and lasted until Friday morning; "moderate" amount. Associated with abdominal cramping. Cramping improved after bowel movement. Known external hemorrhoid that PCP is observing. Felt her hemorrhoid was flared at that time. Topical OTC treatment ineffective. Epigastric pain significantly improved with better adherence to antireflux measures. Denies N/V, fever, chills, unintentional weight loss. Denies chest pain, dyspnea, dizziness, lightheadedness, syncope, near syncope. Denies any other upper or lower GI symptoms.  Past Medical History:  Diagnosis Date  . Allergy    Seasonal  . Chicken pox   . Depression   . Fibromyalgia   . Frequent headaches   . Hypertension   . Migraines     Past Surgical History:  Procedure Laterality Date  . CESAREAN SECTION     2003, 2005, 2009  . CHOLECYSTECTOMY N/A 04/04/2015   Procedure: LAPAROSCOPIC CHOLECYSTECTOMY;  Surgeon: Mark Bird, MD;  Location: [facility] ORS;  Service: General;  Laterality: N/A;    Current Outpatient Medications  Medication Sig Dispense Refill  . albuterol (PROVENTIL HFA;VENTOLIN HFA) 108 (90 BASE) MCG/ACT inhaler Inhale 2 puffs into the lungs as needed for wheezing or shortness of breath. 1 Inhaler 1  . ALPRAZolam (XANAX) 0.25 MG tablet TAKE 1 TABLET BY   MOUTH TWICE DAILY AS NEEDED FOR ANXIETY 60 tablet 0  . amLODipine (NORVASC) 5 MG tablet TAKE 1 TABLET DAILY 90 tablet 3  . diphenoxylate-atropine (LOMOTIL) 2.5-0.025 MG tablet Take 1 tablet by mouth 4 (four) times daily as needed for diarrhea or loose stools. 30 tablet 0  . gabapentin (NEURONTIN) 300 MG capsule Take 1 capsule by mouth 2 (two) times daily.    . MELATONIN PO Take by  mouth at bedtime as needed.    . omeprazole (PRILOSEC) 20 MG capsule Take 20 mg by mouth daily.    . Probiotic Product (PROBIOTIC DAILY PO) Take 1 tablet by mouth.    . venlafaxine XR (EFFEXOR-XR) 75 MG 24 hr capsule TAKE 1 CAPSULE DAILY WITH BREAKFAST 90 capsule 3  . Vitamin D, Cholecalciferol, 1000 UNITS TABS Take by mouth daily.     No current facility-administered medications for this visit.     Allergies as of 06/24/2018 - Review Complete 06/24/2018  Allergen Reaction Noted  . Augmentin [amoxicillin-pot clavulanate]  12/30/2016    Family History  Problem Relation Age of Onset  . Multiple sclerosis Mother   . Hypertension Father   . Cancer Maternal Grandmother 50       Breast Cancer  . Cancer Paternal Grandfather        Lung Cancer  . Anuerysm Paternal Grandmother   . Heart disease Daughter   . Colon cancer Neg Hx   . Gastric cancer Neg Hx   . Esophageal cancer Neg Hx   . Crohn's disease Neg Hx   . Ulcerative colitis Neg Hx     Social History   Socioeconomic History  . Marital status: Married    Spouse name: Not on file  . Number of children: Not on file  . Years of education: Not on file  . Highest education level: Not on file  Occupational History  . Not on file  Social Needs  . Financial resource strain: Not on file  . Food insecurity:    Worry: Not on file    Inability: Not on file  . Transportation needs:    Medical: Not on file    Non-medical: Not on file  Tobacco Use  . Smoking status: Current Every Day Smoker    Packs/day: 1.00    Types: Cigarettes    Last attempt to quit: 02/22/2018    Years since quitting: 0.3  . Smokeless tobacco: Never Used  Substance and Sexual Activity  . Alcohol use: No    Alcohol/week: 0.0 standard drinks  . Drug use: No  . Sexual activity: Yes    Partners: Male    Birth control/protection: Surgical    Comment: Husband  Lifestyle  . Physical activity:    Days per week: Not on file    Minutes per session: Not on  file  . Stress: Not on file  Relationships  . Social connections:    Talks on phone: Not on file    Gets together: Not on file    Attends religious service: Not on file    Active member of club or organization: Not on file    Attends meetings of clubs or organizations: Not on file    Relationship status: Not on file  Other Topics Concern  . 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 ENT: Negative for hoarseness, difficulty swallowing. CV: Negative for chest pain, angina, palpitations, peripheral edema.  Respiratory: Negative for dyspnea at rest, cough, sputum, wheezing.  GI: See history of present illness. Endo: Negative for unusual weight change.  Heme: Negative for bruising or bleeding. Allergy: Negative for rash or hives.   Physical Exam: BP 132/88   Pulse (!) 109   Temp (!) 97.1 F (36.2 C) (Oral)   Ht 5' 3" (1.6 m)   Wt 240 lb 12.8 oz (109.2 kg)   LMP 06/01/2018 (Approximate)   BMI 42.66 kg/m  General:   Alert and oriented. Pleasant and cooperative. Well-nourished and well-developed.  Eyes:  Without icterus, sclera clear and conjunctiva pink.  Ears:  Normal auditory acuity. Cardiovascular:  S1, S2 present without murmurs appreciated. Extremities without clubbing or edema. Respiratory:  Clear to auscultation bilaterally. No wheezes, rales, or rhonchi. No distress.  Gastrointestinal:  +BS, soft, and non-distended. Mild TTP epigastric area. No HSM noted. No guarding or rebound. No masses appreciated.  Rectal:  Deferred  Musculoskalatal:  Symmetrical without gross deformities.  Neurologic:  Alert and oriented x4;  grossly normal neurologically. Psych:  Alert and cooperative. Normal mood and affect. Heme/Lymph/Immune: No excessive  bruising noted.    06/24/2018 8:31 AM   Disclaimer: This note was dictated with voice recognition software. Similar sounding words can inadvertently be transcribed and may not be corrected upon review.  

## 2018-06-24 NOTE — Assessment & Plan Note (Signed)
The patient has known external hemorrhoid.  She has been having some rectal bleeding last week.  Possible hemorrhoid flare.  Tucks pads were not working.  I will send in Anusol rectal cream to help with hemorrhoid flares.  Return for follow-up in 4 months.  Colonoscopy as per below.

## 2018-06-24 NOTE — H&P (View-Only) (Signed)
Referring Provider: Tanna Furry Primary Care Physician:  Tanna Furry, MD Primary GI:  Dr. Jena Gauss  Chief Complaint  Patient presents with  . Diarrhea  . Rectal Bleeding    bright red; last week for approx 4 days  . Abdominal Pain    HPI:   Tiffany Jackson is a 37 y.o. female who presents for follow-up on diarrhea and abdominal pain.  The patient was last seen in our office 03/24/2018 for functional diarrhea and epigastric pain.  Noted history of chronic diarrhea since cholecystectomy 2 years prior.  Despite cholestyramine she noted persistent foul-smelling large-volume mucousy diarrhea up to 8 times a day.  Stool studies were completed and no definitive results reported.  Normal CBC and white count.  Repeat of GI pathogen panel 12/23/2017 was normal.  Recommended Bentyl 10 mg 4 times a day at some point.  Eventually she stated Bentyl did not really help but noted worsening with cheese and queried lactose issue.  At her last visit she states eliminating dairy did help but it is hard to do because she loves cheese.  Diarrhea is somewhat improved down from 8 stools a day to 2-3 stools a day.  Has made dietary changes.  Bentyl did not help.  Known hiatal hernia and hospital admission for abdominal pain treated with antireflux measures.  Change from Zantac to omeprazole has helped.  She notes that she often forgets to take her medications because she does not have a standard routine to help her remember to take her medications.  No other GI symptoms.  Commended take medications as prescribed, try Lomotil for diarrhea control, colonoscopy, follow-up in 3 months.  The patient called our office 04/20/2018 to cancel her colonoscopy because it would cost her $1500.  She also sent a message saying her family was worried she has diverticulitis because it runs in her family.  I explained that the only way to diagnose diverticular disease is with colonoscopy and I doubt active, infected  diverticulitis based on her symptoms although to diagnose diverticulitis who would likely need a CT scan.  However, she felt with sticking to her antireflux measures more closely that her pain was improving.  Today she states she's doing ok overall. She started having rectal bleeding about a week ago on Tuesday and lasted until Friday morning; "moderate" amount. Associated with abdominal cramping. Cramping improved after bowel movement. Known external hemorrhoid that PCP is observing. Felt her hemorrhoid was flared at that time. Topical OTC treatment ineffective. Epigastric pain significantly improved with better adherence to antireflux measures. Denies N/V, fever, chills, unintentional weight loss. Denies chest pain, dyspnea, dizziness, lightheadedness, syncope, near syncope. Denies any other upper or lower GI symptoms.  Past Medical History:  Diagnosis Date  . Allergy    Seasonal  . Chicken pox   . Depression   . Fibromyalgia   . Frequent headaches   . Hypertension   . Migraines     Past Surgical History:  Procedure Laterality Date  . CESAREAN SECTION     2003, 2005, 2009  . CHOLECYSTECTOMY N/A 04/04/2015   Procedure: LAPAROSCOPIC CHOLECYSTECTOMY;  Surgeon: Natale Lay, MD;  Location: [facility] OR[facility];  Service: General;  Laterality: N/A;    Current Outpatient Medications  Medication Sig Dispense Refill  . albuterol (PROVENTIL HFA;VENTOLIN HFA) 108 (90 BASE) MCG/ACT inhaler Inhale 2 puffs into the lungs as needed for wheezing or shortness of breath. 1 Inhaler 1  . ALPRAZolam (XANAX) 0.25 MG tablet TAKE 1 TABLET BY  MOUTH TWICE DAILY AS NEEDED FOR ANXIETY 60 tablet 0  . amLODipine (NORVASC) 5 MG tablet TAKE 1 TABLET DAILY 90 tablet 3  . diphenoxylate-atropine (LOMOTIL) 2.5-0.025 MG tablet Take 1 tablet by mouth 4 (four) times daily as needed for diarrhea or loose stools. 30 tablet 0  . gabapentin (NEURONTIN) 300 MG capsule Take 1 capsule by mouth 2 (two) times daily.    Marland Kitchen MELATONIN PO Take by  mouth at bedtime as needed.    Marland Kitchen omeprazole (PRILOSEC) 20 MG capsule Take 20 mg by mouth daily.    . Probiotic Product (PROBIOTIC DAILY PO) Take 1 tablet by mouth.    . venlafaxine XR (EFFEXOR-XR) 75 MG 24 hr capsule TAKE 1 CAPSULE DAILY WITH BREAKFAST 90 capsule 3  . Vitamin D, Cholecalciferol, 1000 UNITS TABS Take by mouth daily.     No current facility-administered medications for this visit.     Allergies as of 06/24/2018 - Review Complete 06/24/2018  Allergen Reaction Noted  . Augmentin [amoxicillin-pot clavulanate]  12/30/2016    Family History  Problem Relation Age of Onset  . Multiple sclerosis Mother   . Hypertension Father   . Cancer Maternal Grandmother 50       Breast Cancer  . Cancer Paternal Grandfather        Lung Cancer  . Anuerysm Paternal Grandmother   . Heart disease Daughter   . Colon cancer Neg Hx   . Gastric cancer Neg Hx   . Esophageal cancer Neg Hx   . Crohn's disease Neg Hx   . Ulcerative colitis Neg Hx     Social History   Socioeconomic History  . Marital status: Married    Spouse name: Not on file  . Number of children: Not on file  . Years of education: Not on file  . Highest education level: Not on file  Occupational History  . Not on file  Social Needs  . Financial resource strain: Not on file  . Food insecurity:    Worry: Not on file    Inability: Not on file  . Transportation needs:    Medical: Not on file    Non-medical: Not on file  Tobacco Use  . Smoking status: Current Every Day Smoker    Packs/day: 1.00    Types: Cigarettes    Last attempt to quit: 02/22/2018    Years since quitting: 0.3  . Smokeless tobacco: Never Used  Substance and Sexual Activity  . Alcohol use: No    Alcohol/week: 0.0 standard drinks  . Drug use: No  . Sexual activity: Yes    Partners: Male    Birth control/protection: Surgical    Comment: Husband  Lifestyle  . Physical activity:    Days per week: Not on file    Minutes per session: Not on  file  . Stress: Not on file  Relationships  . Social connections:    Talks on phone: Not on file    Gets together: Not on file    Attends religious service: Not on file    Active member of club or organization: Not on file    Attends meetings of clubs or organizations: Not on file    Relationship status: Not on file  Other Topics Concern  . 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 ENT: Negative for hoarseness, difficulty swallowing. CV: Negative for chest pain, angina, palpitations, peripheral edema.  Respiratory: Negative for dyspnea at rest, cough, sputum, wheezing.  GI: See history of present illness. Endo: Negative for unusual weight change.  Heme: Negative for bruising or bleeding. Allergy: Negative for rash or hives.   Physical Exam: BP 132/88   Pulse (!) 109   Temp (!) 97.1 F (36.2 C) (Oral)   Ht 5\' 3"  (1.6 m)   Wt 240 lb 12.8 oz (109.2 kg)   LMP 06/01/2018 (Approximate)   BMI 42.66 kg/m  General:   Alert and oriented. Pleasant and cooperative. Well-nourished and well-developed.  Eyes:  Without icterus, sclera clear and conjunctiva pink.  Ears:  Normal auditory acuity. Cardiovascular:  S1, S2 present without murmurs appreciated. Extremities without clubbing or edema. Respiratory:  Clear to auscultation bilaterally. No wheezes, rales, or rhonchi. No distress.  Gastrointestinal:  +BS, soft, and non-distended. Mild TTP epigastric area. No HSM noted. No guarding or rebound. No masses appreciated.  Rectal:  Deferred  Musculoskalatal:  Symmetrical without gross deformities.  Neurologic:  Alert and oriented x4;  grossly normal neurologically. Psych:  Alert and cooperative. Normal mood and affect. Heme/Lymph/Immune: No excessive  bruising noted.    06/24/2018 8:31 AM   Disclaimer: This note was dictated with voice recognition software. Similar sounding words can inadvertently be transcribed and may not be corrected upon review.

## 2018-06-24 NOTE — Assessment & Plan Note (Signed)
Epigastric abdominal pain improved with improvement in GERD symptoms.  Lower abdominal crampy pain associated with chronic diarrhea and as of last week rectal bleeding.  I again recommended a colonoscopy and she agrees.  We will proceed with colonoscopy at this time to further evaluate her rectal bleeding, chronic diarrhea, abdominal pain.  Follow-up in 4 months.

## 2018-06-24 NOTE — Telephone Encounter (Signed)
Pre-op scheduled for 07/12/18 at 1:15pm. Patient aware. Letter mailed

## 2018-06-24 NOTE — Assessment & Plan Note (Signed)
GERD symptoms improved with better adherence to antireflux measures.  Recommend she continue to take her PPI.  Follow-up in 4 months.

## 2018-07-07 NOTE — Patient Instructions (Signed)
Tiffany Jackson  07/07/2018     @PREFPERIOPPHARMACY @   Your procedure is scheduled on  07/15/2018   Report to [facility] at  1220   P.M.  Call this number if you have problems the morning of surgery:  [phone]   Remember:  Follow the diet and prep instructions given to you by Dr Luvenia Starch office.                      Take these medicines the morning of surgery with A SIP OF WATER  Xanax ( if needed), amlodipine, gabapentin, prilosec, effexor.    Do not wear jewelry, make-up or nail polish.  Do not wear lotions, powders, or perfumes, or deodorant.  Do not shave 48 hours prior to surgery.  Men may shave face and neck.  Do not bring valuables to the hospital.  [facility] is not responsible for any belongings or valuables.  Contacts, dentures or bridgework may not be worn into surgery.  Leave your suitcase in the car.  After surgery it may be brought to your room.  For patients admitted to the hospital, discharge time will be determined by your treatment team.  Patients discharged the day of surgery will not be allowed to drive home.   Name and phone number of your driver:   family Special instructions:  None  Please read over the following fact sheets that you were given. Anesthesia Post-op Instructions and Care and Recovery After Surgery       Colonoscopy, Adult A colonoscopy is an exam to look at the large intestine. It is done to check for problems, such as:  Lumps (tumors).  Growths (polyps).  Swelling (inflammation).  Bleeding.  What happens before the procedure? Eating and drinking Follow instructions from your doctor about eating and drinking. These instructions may include:  A few days before the procedure - follow a low-fiber diet. ? Avoid nuts. ? Avoid seeds. ? Avoid dried fruit. ? Avoid raw fruits. ? Avoid vegetables.  1-3 days before the procedure - follow a clear liquid diet. Avoid liquids that have red or purple dye. Drink only  clear liquids, such as: ? Clear broth or bouillon. ? Black coffee or tea. ? Clear juice. ? Clear soft drinks or sports drinks. ? Gelatin dessert. ? Popsicles.  On the day of the procedure - do not eat or drink anything during the 2 hours before the procedure.  Bowel prep If you were prescribed an oral bowel prep:  Take it as told by your doctor. Starting the day before your procedure, you will need to drink a lot of liquid. The liquid will cause you to poop (have bowel movements) until your poop is almost clear or light green.  If your skin or butt gets irritated from diarrhea, you may: ? Wipe the area with wipes that have medicine in them, such as adult wet wipes with aloe and vitamin E. ? Put something on your skin that soothes the area, such as petroleum jelly.  If you throw up (vomit) while drinking the bowel prep, take a break for up to 60 minutes. Then begin the bowel prep again. If you keep throwing up and you cannot take the bowel prep without throwing up, call your doctor.  General instructions  Ask your doctor about changing or stopping your normal medicines. This is important if you take diabetes medicines or blood thinners.  Plan to have someone  take you home from the hospital or clinic. What happens during the procedure?  An IV tube may be put into one of your veins.  You will be given medicine to help you relax (sedative).  To reduce your risk of infection: ? Your doctors will wash their hands. ? Your anal area will be washed with soap.  You will be asked to lie on your side with your knees bent.  Your doctor will get a long, thin, flexible tube ready. The tube will have a camera and a light on the end.  The tube will be put into your anus.  The tube will be gently put into your large intestine.  Air will be delivered into your large intestine to keep it open. You may feel some pressure or cramping.  The camera will be used to take photos.  A small tissue  sample may be removed from your body to be looked at under a microscope (biopsy). If any possible problems are found, the tissue will be sent to a lab for testing.  If small growths are found, your doctor may remove them and have them checked for cancer.  The tube that was put into your anus will be slowly removed. The procedure may vary among doctors and hospitals. What happens after the procedure?  Your doctor will check on you often until the medicines you were given have worn off.  Do not drive for 24 hours after the procedure.  You may have a small amount of blood in your poop.  You may pass gas.  You may have mild cramps or bloating in your belly (abdomen).  It is up to you to get the results of your procedure. Ask your doctor, or the department performing the procedure, when your results will be ready. This information is not intended to replace advice given to you by your health care provider. Make sure you discuss any questions you have with your health care provider. Document Released: 09/20/2010 Document Revised: 06/18/2016 Document Reviewed: 10/30/2015 Elsevier Interactive Patient Education  2017 [employer]vier Inc[employer]  Colonoscopy, Adult, Care After This sheet gives you information about how to care for yourself after your procedure. Your health care provider may also give you more specific instructions. If you have problems or questions, contact your health care provider. What can I expect after the procedure? After the procedure, it is common to have:  A small amount of blood in your stool for 24 hours after the procedure.  Some gas.  Mild abdominal cramping or bloating.  Follow these instructions at home: General instructions   For the first 24 hours after the procedure: ? Do not drive or use machinery. ? Do not sign important documents. ? Do not drink alcohol. ? Do your regular daily activities at a slower pace than normal. ? Eat soft, easy-to-digest foods. ? Rest  often.  Take over-the-counter or prescription medicines only as told by your health care provider.  It is up to you to get the results of your procedure. Ask your health care provider, or the department performing the procedure, when your results will be ready. Relieving cramping and bloating  Try walking around when you have cramps or feel bloated.  Apply heat to your abdomen as told by your health care provider. Use a heat source that your health care provider recommends, such as a moist heat pack or a heating pad. ? Place a towel between your skin and the heat source. ? Leave the heat on for  20-30 minutes. ? Remove the heat if your skin turns bright red. This is especially important if you are unable to feel pain, heat, or cold. You may have a greater risk of getting burned. Eating and drinking  Drink enough fluid to keep your urine clear or pale yellow.  Resume your normal diet as instructed by your health care provider. Avoid heavy or fried foods that are hard to digest.  Avoid drinking alcohol for as long as instructed by your health care provider. Contact a health care provider if:  You have blood in your stool 2-3 days after the procedure. Get help right away if:  You have more than a small spotting of blood in your stool.  You pass large blood clots in your stool.  Your abdomen is swollen.  You have nausea or vomiting.  You have a fever.  You have increasing abdominal pain that is not relieved with medicine. This information is not intended to replace advice given to you by your health care provider. Make sure you discuss any questions you have with your health care provider. Document Released: 04/01/2004 Document Revised: 05/12/2016 Document Reviewed: 10/30/2015 Elsevier Interactive Patient Education  2018 [place] Anesthesia is a term that refers to techniques, procedures, and medicines that help a person stay safe and comfortable  during a medical procedure. Monitored anesthesia care, or sedation, is one type of anesthesia. Your anesthesia specialist may recommend sedation if you will be having a procedure that does not require you to be unconscious, such as:  Cataract surgery.  A dental procedure.  A biopsy.  A colonoscopy.  During the procedure, you may receive a medicine to help you relax (sedative). There are three levels of sedation:  Mild sedation. At this level, you may feel awake and relaxed. You will be able to follow directions.  Moderate sedation. At this level, you will be sleepy. You may not remember the procedure.  Deep sedation. At this level, you will be asleep. You will not remember the procedure.  The more medicine you are given, the deeper your level of sedation will be. Depending on how you respond to the procedure, the anesthesia specialist may change your level of sedation or the type of anesthesia to fit your needs. An anesthesia specialist will monitor you closely during the procedure. Let your health care provider know about:  Any allergies you have.  All medicines you are taking, including vitamins, herbs, eye drops, creams, and over-the-counter medicines.  Any use of steroids (by mouth or as a cream).  Any problems you or family members have had with sedatives and anesthetic medicines.  Any blood disorders you have.  Any surgeries you have had.  Any medical conditions you have, such as sleep apnea.  Whether you are pregnant or may be pregnant.  Any use of cigarettes, alcohol, or street drugs. What are the risks? Generally, this is a safe procedure. However, problems may occur, including:  Getting too much medicine (oversedation).  Nausea.  Allergic reaction to medicines.  Trouble breathing. If this happens, a breathing tube may be used to help with breathing. It will be removed when you are awake and breathing on your own.  Heart trouble.  Lung trouble.  Before  the procedure Staying hydrated Follow instructions from your health care provider about hydration, which may include:  Up to 2 hours before the procedure - you may continue to drink clear liquids, such as water, clear fruit juice, black coffee, and  plain tea.  Eating and drinking restrictions Follow instructions from your health care provider about eating and drinking, which may include:  8 hours before the procedure - stop eating heavy meals or foods such as meat, fried foods, or fatty foods.  6 hours before the procedure - stop eating light meals or foods, such as toast or cereal.  6 hours before the procedure - stop drinking milk or drinks that contain milk.  2 hours before the procedure - stop drinking clear liquids.  Medicines Ask your health care provider about:  Changing or stopping your regular medicines. This is especially important if you are taking diabetes medicines or blood thinners.  Taking medicines such as aspirin and ibuprofen. These medicines can thin your blood. Do not take these medicines before your procedure if your health care provider instructs you not to.  Tests and exams  You will have a physical exam.  You may have blood tests done to show: ? How well your kidneys and liver are working. ? How well your blood can clot.  General instructions  Plan to have someone take you home from the hospital or clinic.  If you will be going home right after the procedure, plan to have someone with you for 24 hours.  What happens during the procedure?  Your blood pressure, heart rate, breathing, level of pain and overall condition will be monitored.  An IV tube will be inserted into one of your veins.  Your anesthesia specialist will give you medicines as needed to keep you comfortable during the procedure. This may mean changing the level of sedation.  The procedure will be performed. After the procedure  Your blood pressure, heart rate, breathing rate, and  blood oxygen level will be monitored until the medicines you were given have worn off.  Do not drive for 24 hours if you received a sedative.  You may: ? Feel sleepy, clumsy, or nauseous. ? Feel forgetful about what happened after the procedure. ? Have a sore throat if you had a breathing tube during the procedure. ? Vomit. This information is not intended to replace advice given to you by your health care provider. Make sure you discuss any questions you have with your health care provider. Document Released: 05/14/2005 Document Revised: 01/25/2016 Document Reviewed: 12/09/2015 Elsevier Interactive Patient Education  2018 [place], Care After These instructions provide you with information about caring for yourself after your procedure. Your health care provider may also give you more specific instructions. Your treatment has been planned according to current medical practices, but problems sometimes occur. Call your health care provider if you have any problems or questions after your procedure. What can I expect after the procedure? After your procedure, it is common to:  Feel sleepy for several hours.  Feel clumsy and have poor balance for several hours.  Feel forgetful about what happened after the procedure.  Have poor judgment for several hours.  Feel nauseous or vomit.  Have a sore throat if you had a breathing tube during the procedure.  Follow these instructions at home: For at least 24 hours after the procedure:   Do not: ? Participate in activities in which you could fall or become injured. ? Drive. ? Use heavy machinery. ? Drink alcohol. ? Take sleeping pills or medicines that cause drowsiness. ? Make important decisions or sign legal documents. ? Take care of children on your own.  Rest. Eating and drinking  Follow the diet that is  recommended by your health care provider.  If you vomit, drink water, juice, or soup when you  can drink without vomiting.  Make sure you have little or no nausea before eating solid foods. General instructions  Have a responsible adult stay with you until you are awake and alert.  Take over-the-counter and prescription medicines only as told by your health care provider.  If you smoke, do not smoke without supervision.  Keep all follow-up visits as told by your health care provider. This is important. Contact a health care provider if:  You keep feeling nauseous or you keep vomiting.  You feel light-headed.  You develop a rash.  You have a fever. Get help right away if:  You have trouble breathing. This information is not intended to replace advice given to you by your health care provider. Make sure you discuss any questions you have with your health care provider. Document Released: 12/09/2015 Document Revised: 04/09/2016 Document Reviewed: 12/09/2015 Elsevier Interactive Patient Education  [employer].

## 2018-07-12 ENCOUNTER — Encounter (HOSPITAL_COMMUNITY)
Admission: RE | Admit: 2018-07-12 | Discharge: 2018-07-12 | Disposition: A | Discharge status: Home / Self Care | Payer: BLUE CROSS/BLUE SHIELD | Source: Ambulatory Visit | Attending: Internal Medicine | Admitting: Internal Medicine

## 2018-07-12 ENCOUNTER — Encounter (HOSPITAL_COMMUNITY): Payer: Self-pay

## 2018-07-12 ENCOUNTER — Other Ambulatory Visit: Payer: Self-pay

## 2018-07-12 DIAGNOSIS — Z01812 Encounter for preprocedural laboratory examination: Secondary | ICD-10-CM | POA: Diagnosis not present

## 2018-07-12 HISTORY — DX: Anxiety disorder, unspecified: F41.9

## 2018-07-12 HISTORY — DX: Gastro-esophageal reflux disease without esophagitis: K21.9

## 2018-07-12 LAB — BASIC METABOLIC PANEL
Anion gap: 7 (ref 5–15)
BUN: 8 mg/dL (ref 6–20)
CHLORIDE: 108 mmol/L (ref 98–111)
CO2: 22 mmol/L (ref 22–32)
CREATININE: 0.69 mg/dL (ref 0.44–1.00)
Calcium: 8.6 mg/dL — ABNORMAL LOW (ref 8.9–10.3)
GFR calc Af Amer: >60 mL/min (ref >60)
Glucose, Bld: 129 mg/dL — ABNORMAL HIGH (ref 70–99)
Potassium: 3.2 mmol/L — ABNORMAL LOW (ref 3.5–5.1)
SODIUM: 137 mmol/L (ref 135–145)

## 2018-07-12 LAB — HCG, SERUM, QUALITATIVE: PREG SERUM: NEGATIVE

## 2018-07-12 LAB — CBC WITH DIFFERENTIAL/PLATELET
Abs Immature Granulocytes: 0.01 10*3/uL (ref 0.00–0.07)
BASOS PCT: 0 %
Basophils Absolute: 0 10*3/uL (ref 0.0–0.1)
EOS PCT: 2 %
Eosinophils Absolute: 0.1 10*3/uL (ref 0.0–0.5)
HCT: 41.5 % (ref 36.0–46.0)
HEMOGLOBIN: 13.4 g/dL (ref 12.0–15.0)
Immature Granulocytes: 0 %
LYMPHS PCT: 36 %
Lymphs Abs: 2.5 10*3/uL (ref 0.7–4.0)
MCH: 29.8 pg (ref 26.0–34.0)
MCHC: 32.3 g/dL (ref 30.0–36.0)
MCV: 92.2 fL (ref 80.0–100.0)
Monocytes Absolute: 0.2 10*3/uL (ref 0.1–1.0)
Monocytes Relative: 4 %
Neutro Abs: 4 10*3/uL (ref 1.7–7.7)
Neutrophils Relative %: 58 %
PLATELETS: 322 10*3/uL (ref 150–400)
RBC: 4.5 MIL/uL (ref 3.87–5.11)
RDW: 12.2 % (ref 11.5–15.5)
WBC: 7 10*3/uL (ref 4.0–10.5)
nRBC: 0 % (ref 0.0–0.2)

## 2018-07-15 ENCOUNTER — Ambulatory Visit (HOSPITAL_COMMUNITY): Payer: BLUE CROSS/BLUE SHIELD | Admitting: Anesthesiology

## 2018-07-15 ENCOUNTER — Encounter (HOSPITAL_COMMUNITY)
Admission: RE | Disposition: A | Discharge status: Home / Self Care | Payer: Self-pay | Source: Ambulatory Visit | Attending: Internal Medicine

## 2018-07-15 ENCOUNTER — Ambulatory Visit (HOSPITAL_COMMUNITY)
Admission: RE | Admit: 2018-07-15 | Discharge: 2018-07-15 | Disposition: A | Discharge status: Home / Self Care | Payer: BLUE CROSS/BLUE SHIELD | Source: Ambulatory Visit | Attending: Internal Medicine | Admitting: Internal Medicine

## 2018-07-15 ENCOUNTER — Encounter (HOSPITAL_COMMUNITY): Payer: Self-pay | Admitting: *Deleted

## 2018-07-15 ENCOUNTER — Other Ambulatory Visit: Payer: Self-pay

## 2018-07-15 DIAGNOSIS — K5730 Diverticulosis of large intestine without perforation or abscess without bleeding: Secondary | ICD-10-CM | POA: Diagnosis not present

## 2018-07-15 DIAGNOSIS — Z6841 Body Mass Index (BMI) 40.0 and over, adult: Secondary | ICD-10-CM | POA: Insufficient documentation

## 2018-07-15 DIAGNOSIS — K219 Gastro-esophageal reflux disease without esophagitis: Secondary | ICD-10-CM | POA: Diagnosis not present

## 2018-07-15 DIAGNOSIS — K648 Other hemorrhoids: Secondary | ICD-10-CM | POA: Diagnosis not present

## 2018-07-15 DIAGNOSIS — M797 Fibromyalgia: Secondary | ICD-10-CM | POA: Insufficient documentation

## 2018-07-15 DIAGNOSIS — K625 Hemorrhage of anus and rectum: Secondary | ICD-10-CM

## 2018-07-15 DIAGNOSIS — K921 Melena: Secondary | ICD-10-CM | POA: Diagnosis not present

## 2018-07-15 DIAGNOSIS — Z9049 Acquired absence of other specified parts of digestive tract: Secondary | ICD-10-CM | POA: Insufficient documentation

## 2018-07-15 DIAGNOSIS — F329 Major depressive disorder, single episode, unspecified: Secondary | ICD-10-CM | POA: Insufficient documentation

## 2018-07-15 DIAGNOSIS — R197 Diarrhea, unspecified: Secondary | ICD-10-CM | POA: Diagnosis not present

## 2018-07-15 DIAGNOSIS — Z79899 Other long term (current) drug therapy: Secondary | ICD-10-CM | POA: Insufficient documentation

## 2018-07-15 DIAGNOSIS — I10 Essential (primary) hypertension: Secondary | ICD-10-CM | POA: Diagnosis not present

## 2018-07-15 DIAGNOSIS — F17210 Nicotine dependence, cigarettes, uncomplicated: Secondary | ICD-10-CM | POA: Diagnosis not present

## 2018-07-15 DIAGNOSIS — K649 Unspecified hemorrhoids: Secondary | ICD-10-CM

## 2018-07-15 HISTORY — PX: COLONOSCOPY WITH PROPOFOL: SHX5780

## 2018-07-15 SURGERY — COLONOSCOPY WITH PROPOFOL
Anesthesia: Monitor Anesthesia Care

## 2018-07-15 MED ORDER — HYDROMORPHONE HCL 1 MG/ML IJ SOLN: 0.2500 mg | INTRAMUSCULAR | Status: DC | PRN

## 2018-07-15 MED ORDER — LACTATED RINGERS IV SOLN
INTRAVENOUS | Status: DC
  Administered 2018-07-15: 14:00:00 via INTRAVENOUS

## 2018-07-15 MED ORDER — MEPERIDINE HCL 100 MG/ML IJ SOLN: 6.2500 mg | INTRAMUSCULAR | Status: DC | PRN

## 2018-07-15 MED ORDER — PROPOFOL 10 MG/ML IV BOLUS
INTRAVENOUS | Status: DC | PRN
  Administered 2018-07-15: 40 mg via INTRAVENOUS
  Administered 2018-07-15: 45 mg via INTRAVENOUS
  Administered 2018-07-15: 40 mg via INTRAVENOUS

## 2018-07-15 MED ORDER — CHLORHEXIDINE GLUCONATE CLOTH 2 % EX PADS: 6.0000 | MEDICATED_PAD | Freq: Once | CUTANEOUS | Status: DC

## 2018-07-15 MED ORDER — HYDROCODONE-ACETAMINOPHEN 7.5-325 MG PO TABS: 1.0000 | ORAL_TABLET | Freq: Once | ORAL | Status: DC | PRN

## 2018-07-15 MED ORDER — PROPOFOL 500 MG/50ML IV EMUL
INTRAVENOUS | Status: DC | PRN
  Administered 2018-07-15: 120 ug/kg/min via INTRAVENOUS
  Administered 2018-07-15: 150 ug/kg/min via INTRAVENOUS

## 2018-07-15 MED ORDER — PROMETHAZINE HCL 25 MG/ML IJ SOLN: 6.2500 mg | INTRAMUSCULAR | Status: DC | PRN

## 2018-07-15 MED ORDER — LACTATED RINGERS IV SOLN: INTRAVENOUS | Status: DC

## 2018-07-15 NOTE — Anesthesia Preprocedure Evaluation (Signed)
Anesthesia Evaluation    Airway Mallampati: II       Dental  (+) Teeth Intact   Pulmonary Current Smoker,     + decreased breath sounds      Cardiovascular hypertension, On Medications  Rhythm:regular     Neuro/Psych    GI/Hepatic GERD  Medicated,  Endo/Other    Renal/GU      Musculoskeletal   Abdominal   Peds  Hematology   Anesthesia Other Findings Morbid obesity fibromyalgia  Reproductive/Obstetrics                             Anesthesia Physical Anesthesia Plan  ASA: III  Anesthesia Plan: MAC   Post-op Pain Management:    Induction:   PONV Risk Score and Plan:   Airway Management Planned:   Additional Equipment:   Intra-op Plan:   Post-operative Plan:   Informed Consent:   Plan Discussed with: Anesthesiologist  Anesthesia Plan Comments:         Anesthesia Quick Evaluation

## 2018-07-15 NOTE — Anesthesia Procedure Notes (Signed)
Procedure Name: MAC Date/Time: 07/15/2018 2:28 PM Performed by: Vista Deck, CRNA Pre-anesthesia Checklist: Patient identified, Emergency Drugs available, Suction available, Timeout performed and Patient being monitored Patient Re-evaluated:Patient Re-evaluated prior to induction Oxygen Delivery Method: Nasal Cannula

## 2018-07-15 NOTE — Transfer of Care (Signed)
Immediate Anesthesia Transfer of Care Note  Patient: Tiffany Jackson  Procedure(s) Performed: COLONOSCOPY WITH PROPOFOL (N/A )  Patient Location: PACU  Anesthesia Type:MAC  Level of Consciousness: awake and patient cooperative  Airway & Oxygen Therapy: Patient Spontanous Breathing  Post-op Assessment: Report given to RN and Post -op Vital signs reviewed and stable  Post vital signs: Reviewed and stable  Last Vitals:  Vitals Value Taken Time  BP    Temp    Pulse 88 07/15/2018  2:56 PM  Resp 24 07/15/2018  2:56 PM  SpO2 100 % 07/15/2018  2:56 PM  Vitals shown include unvalidated device data.  Last Pain:  Vitals:   07/15/18 1431  TempSrc:   PainSc: 0-No pain      Patients Stated Pain Goal: 6 (47/[id number])  Complications: No apparent anesthesia complications

## 2018-07-15 NOTE — Interval H&P Note (Signed)
History and Physical Interval Note:  07/15/2018 2:22 PM  Tiffany Jackson  has presented today for surgery, with the diagnosis of rectal bleeding, hemorrhoids  The various methods of treatment have been discussed with the patient and family. After consideration of risks, benefits and other options for treatment, the patient has consented to  Procedure(s) with comments: COLONOSCOPY WITH PROPOFOL (N/A) - 2:00pm as a surgical intervention .  The patient's history has been reviewed, patient examined, no change in status, stable for surgery.  I have reviewed the patient's chart and labs.  Questions were answered to the patient's satisfaction.     Robert Rourk  No change.  Diagnostic colonoscopy per plan.  The risks, benefits, limitations, alternatives and imponderables have been reviewed with the patient. Questions have been answered. All parties are agreeable.

## 2018-07-15 NOTE — Op Note (Signed)
[facility] Patient Name: Tiffany Jackson Procedure Date: 07/15/2018 2:28 PM MRN: [id number] Date of Birth: [birth year] Attending MD: Gennette Pac , MD CSN: [id number] Age: 37 Admit Type: Outpatient Procedure:                Colonoscopy Indications:              Hematochezia Providers:                Gennette Pac, MD, Jannett Celestine, RN, Edythe Clarity, Technician Referring MD:              Medicines:                Propofol per Anesthesia Complications:            No immediate complications. Estimated Blood Loss:     Estimated blood loss: none. Procedure:                Pre-Anesthesia Assessment:                           - Prior to the procedure, a History and Physical                            was performed, and patient medications and                            allergies were reviewed. The patient's tolerance of                            previous anesthesia was also reviewed. The risks                            and benefits of the procedure and the sedation                            options and risks were discussed with the patient.                            All questions were answered, and informed consent                            was obtained. Prior Anticoagulants: The patient has                            taken no previous anticoagulant or antiplatelet                            agents. ASA Grade Assessment: II - A patient with                            mild systemic disease. After reviewing the risks                            and  benefits, the patient was deemed in                            satisfactory condition to undergo the procedure.                           After obtaining informed consent, the colonoscope                            was passed under direct vision. Throughout the                            procedure, the patient's blood pressure, pulse, and                            oxygen saturations were monitored  continuously. The                            [id number]-[id number] ([id number]) scope was introduced through                            the and advanced to the the cecum, identified by                            appendiceal orifice and ileocecal valve. The                            colonoscopy was performed without difficulty. The                            patient tolerated the procedure well. The quality                            of the bowel preparation was adequate. The                            ileocecal valve, appendiceal orifice, and rectum                            were photographed. Scope In: 2:37:03 PM Scope Out: 2:50:00 PM Scope Withdrawal Time: 0 hours 9 minutes 1 second  Total Procedure Duration: 0 hours 12 minutes 57 seconds  Findings:      The perianal and digital rectal examinations were normal      Non-bleeding internal hemorrhoids were found during retroflexion. The       hemorrhoids were moderate and medium-sized.      The exam was otherwise without abnormality on direct and retroflexion       views.      A few medium-mouthed diverticula were found in the entire colon. Impression:               - Non-bleeding internal hemorrhoids.                           - The examination was otherwise normal on direct  and retroflexion views.                           - Diverticulosis in the entire examined colon.                           - No specimens collected. Moderate Sedation:      Moderate (conscious) sedation was personally administered by an       anesthesia professional. The following parameters were monitored: oxygen       saturation, heart rate, blood pressure, respiratory rate, EKG, adequacy       of pulmonary ventilation, and response to care. Recommendation:           - Patient has a contact number available for                            emergencies. The signs and symptoms of potential                            delayed complications were discussed  with the                            patient. Return to normal activities tomorrow.                            Written discharge instructions were provided to the                            patient.                           - Advance diet as tolerated.                           - Repeat colonoscopy at age 16 for screening                            purposes.                           - Return to GI office in 6 weeks. Begin Benefiber 1                            tablespoon twice daily. Pamphlet on hemorrhoid                            banding provided. Office visit with [place] in 6 weeks. Procedure Code(s):        --- Professional ---                           [phone], Colonoscopy, flexible; diagnostic, including                            collection of specimen(s) by brushing or washing,  when performed (separate procedure) Diagnosis Code(s):        --- Professional ---                           K64.8, Other hemorrhoids                           K92.1, Melena (includes Hematochezia)                           K57.30, Diverticulosis of large intestine without                            perforation or abscess without bleeding CPT copyright 2018 American Medical Association. All rights reserved. The codes documented in this report are preliminary and upon coder review may  be revised to meet current compliance requirements. Gerrit Friendsobert M. Bernisha Verma, MD Gennette Pacobert Michael Allex Madia, MD 07/15/2018 3:23:20 PM This report has been signed electronically. Number of Addenda: 0

## 2018-07-15 NOTE — Anesthesia Postprocedure Evaluation (Signed)
Anesthesia Post Note  Patient: Tiffany Jackson  Procedure(s) Performed: COLONOSCOPY WITH PROPOFOL (N/A )  Patient location during evaluation: Short Stay Anesthesia Type: MAC Level of consciousness: awake and alert and patient cooperative Pain management: satisfactory to patient Vital Signs Assessment: post-procedure vital signs reviewed and stable Respiratory status: spontaneous breathing Cardiovascular status: stable Postop Assessment: no apparent nausea or vomiting Anesthetic complications: no     Last Vitals:  Vitals:   07/15/18 1515 07/15/18 1522  BP: (!) 135/93 (!) 135/91  Pulse: 75 70  Resp: 16   Temp:  36.7 C  SpO2: 100% 100%    Last Pain:  Vitals:   07/15/18 1522  TempSrc: Oral  PainSc: 0-No pain                 Sloane Junkin

## 2018-07-15 NOTE — Discharge Instructions (Signed)
Disposable Sitz Bath A disposable sitz bath is a plastic basin that fits over the toilet. A bag is hung above the toilet, and the bag is connected to a tube that opens into the basin. The bag is filled with warm water that flows into the basin through the tube. A sitz bath can be used to help relieve symptoms, clean, and promote healing in the genital and anal areas, as well as in the lower abdomen and buttocks. What are the risks? Sitz baths are generally very safe. It is possible for the skin between the genitals and the anus (perineum) to become infected, but this is rare. You can avoid this by cleaning your sitz bath supplies thoroughly. How to use a disposable sitz bath 1. Close the clamp on the tube. Make sure the clamp is closed tightly to prevent leakage. 2. Fill the sitz bath basin and the plastic bag with warm water. The water should be warm enough to be comfortable, but not hot. 3. Raise the toilet seat and place the filled basin on the toilet. Make sure the overflow opening is facing toward the back of the toilet. ? If you prefer, you may place the empty basin on the toilet first, and then use the plastic bag to fill the basin with warm water. 4. Hang the filled plastic bag overhead on a hook or towel rack close to the toilet. The bag should be higher than the toilet so that the water will flow down through the tube. 5. Attach the tube to the opening on the basin. Make sure that the tube is attached to the basin tightly to prevent leakage. 6. Sit on the basin and release the clamp. This will allow warm water to flow into the basin and flush the area around your genitals and anus. 7. Remain sitting on the basin for about 15-20 minutes, or as long as told by your health care provider. 8. Stand up and gently pat your skin dry. If directed, apply clean bandages (dressings) to the affected area as told by your health care provider. 9. Carefully remove the basin from the toilet seat and tip  the basin into the toilet to empty any remaining water. Empty any remaining water from the plastic bag into the toilet. Then, flush the toilet. 10. Wash the basin with warm water and soap. Let the basin air dry in the sink. You should also let the plastic bag and the tubing air dry. 11. Store the basin, tubing, and plastic bag in a clean, dry area. 12. Wash your hands with soap and water. If soap and water are not available, use hand sanitizer. Contact a health care provider if:  You have symptoms that get worse instead of better.  You develop new skin irritation, redness, or swelling around your genitals or anus. This information is not intended to replace advice given to you by your health care provider. Make sure you discuss any questions you have with your health care provider. Document Released: 02/17/2012 Document Revised: 01/24/2016 Document Reviewed: 07/08/2015 Elsevier Interactive Patient Education  2018 [employer].  Colonoscopy Discharge Instructions    Disposable Sitz Bath A disposable sitz bath is a plastic basin that fits over the toilet. A bag is hung above the toilet, and the bag is connected to a tube that opens into the basin. The bag is filled with warm water that flows into the basin through the tube. A sitz bath can be used to help relieve symptoms, clean, and  promote healing in the genital and anal areas, as well as in the lower abdomen and buttocks. What are the risks? Sitz baths are generally very safe. It is possible for the skin between the genitals and the anus (perineum) to become infected, but this is rare. You can avoid this by cleaning your sitz bath supplies thoroughly. How to use a disposable sitz bath 13. Close the clamp on the tube. Make sure the clamp is closed tightly to prevent leakage. 14. Fill the sitz bath basin and the plastic bag with warm water. The water should be warm enough to be comfortable, but not hot. 15. Raise the toilet seat and place  the filled basin on the toilet. Make sure the overflow opening is facing toward the back of the toilet. ? If you prefer, you may place the empty basin on the toilet first, and then use the plastic bag to fill the basin with warm water. 16. Hang the filled plastic bag overhead on a hook or towel rack close to the toilet. The bag should be higher than the toilet so that the water will flow down through the tube. 17. Attach the tube to the opening on the basin. Make sure that the tube is attached to the basin tightly to prevent leakage. 18. Sit on the basin and release the clamp. This will allow warm water to flow into the basin and flush the area around your genitals and anus. 19. Remain sitting on the basin for about 15-20 minutes, or as long as told by your health care provider. 20. Stand up and gently pat your skin dry. If directed, apply clean bandages (dressings) to the affected area as told by your health care provider. 21. Carefully remove the basin from the toilet seat and tip the basin into the toilet to empty any remaining water. Empty any remaining water from the plastic bag into the toilet. Then, flush the toilet. 22. Wash the basin with warm water and soap. Let the basin air dry in the sink. You should also let the plastic bag and the tubing air dry. 23. Store the basin, tubing, and plastic bag in a clean, dry area. 24. Wash your hands with soap and water. If soap and water are not available, use hand sanitizer. Contact a health care provider if:  You have symptoms that get worse instead of better.  You develop new skin irritation, redness, or swelling around your genitals or anus. This information is not intended to replace advice given to you by your health care provider. Make sure you discuss any questions you have with your health care provider. Document Released: 02/17/2012 Document Revised: 01/24/2016 Document Reviewed: 07/08/2015 Elsevier Interactive Patient Education  [employer]. Read the instructions outlined below and refer to this sheet in the next few weeks. These discharge instructions provide you with general information on caring for yourself after you leave the hospital. Your doctor may also give you specific instructions. While your treatment has been planned according to the most current medical practices available, unavoidable complications occasionally occur. If you have any problems or questions after discharge, call Dr. Jena Gauss at [phone]. ACTIVITY  You may resume your regular activity, but move at a slower pace for the next 24 hours.   Take frequent rest periods for the next 24 hours.   Walking will help get rid of the air and reduce the bloated feeling in your belly (abdomen).   No driving for 24 hours (because of the medicine (anesthesia)  used during the test).    Do not sign any important legal documents or operate any machinery for 24 hours (because of the anesthesia used during the test).  NUTRITION  Drink plenty of fluids.   You may resume your normal diet as instructed by your doctor.   Begin with a light meal and progress to your normal diet. Heavy or fried foods are harder to digest and may make you feel sick to your stomach (nauseated).   Avoid alcoholic beverages for 24 hours or as instructed.  MEDICATIONS  You may resume your normal medications unless your doctor tells you otherwise.  WHAT YOU CAN EXPECT TODAY  Some feelings of bloating in the abdomen.   Passage of more gas than usual.   Spotting of blood in your stool or on the toilet paper.  IF YOU HAD POLYPS REMOVED DURING THE COLONOSCOPY:  No aspirin products for 7 days or as instructed.   No alcohol for 7 days or as instructed.   Eat a soft diet for the next 24 hours.  FINDING OUT THE RESULTS OF YOUR TEST Not all test results are available during your visit. If your test results are not back during the visit, make an appointment with your caregiver to find  out the results. Do not assume everything is normal if you have not heard from your caregiver or the medical facility. It is important for you to follow up on all of your test results.  SEEK IMMEDIATE MEDICAL ATTENTION IF:  You have more than a spotting of blood in your stool.   Your belly is swollen (abdominal distention).   You are nauseated or vomiting.   You have a temperature over 101.   You have abdominal pain or discomfort that is severe or gets worse throughout the day.   Diverticulosis Diverticulosis is a condition that develops when small pouches (diverticula) form in the wall of the large intestine (colon). The colon is where water is absorbed and stool is formed. The pouches form when the inside layer of the colon pushes through weak spots in the outer layers of the colon. You may have a few pouches or many of them. What are the causes? The cause of this condition is not known. What increases the risk? The following factors may make you more likely to develop this condition:  Being older than age 36. Your risk for this condition increases with age. Diverticulosis is rare among people younger than age 36. By age 36, many people have it.  Eating a low-fiber diet.  Having frequent constipation.  Being overweight.  Not getting enough exercise.  Smoking.  Taking over-the-counter pain medicines, like aspirin and ibuprofen.  Having a family history of diverticulosis.  What are the signs or symptoms? In most people, there are no symptoms of this condition. If you do have symptoms, they may include:  Bloating.  Cramps in the abdomen.  Constipation or diarrhea.  Pain in the lower left side of the abdomen.  How is this diagnosed? This condition is most often diagnosed during an exam for other colon problems. Because diverticulosis usually has no symptoms, it often cannot be diagnosed independently. This condition may be diagnosed by:  Using a flexible scope to  examine the colon (colonoscopy).  Taking an X-ray of the colon after dye has been put into the colon (barium enema).  Doing a CT scan.  How is this treated? You may not need treatment for this condition if you have never developed  an infection related to diverticulosis. If you have had an infection before, treatment may include:  Eating a high-fiber diet. This may include eating more fruits, vegetables, and grains.  Taking a fiber supplement.  Taking a live bacteria supplement (probiotic).  Taking medicine to relax your colon.  Taking antibiotic medicines.  Follow these instructions at home:  Drink 6-8 glasses of water or more each day to prevent constipation.  Try not to strain when you have a bowel movement.  If you have had an infection before: ? Eat more fiber as directed by your health care provider or your diet and nutrition specialist (dietitian). ? Take a fiber supplement or probiotic, if your health care provider approves.  Take over-the-counter and prescription medicines only as told by your health care provider.  If you were prescribed an antibiotic, take it as told by your health care provider. Do not stop taking the antibiotic even if you start to feel better.  Keep all follow-up visits as told by your health care provider. This is important. Contact a health care provider if:  You have pain in your abdomen.  You have bloating.  You have cramps.  You have not had a bowel movement in 3 days. Get help right away if:  Your pain gets worse.  Your bloating becomes very bad.  You have a fever or chills, and your symptoms suddenly get worse.  You vomit.  You have bowel movements that are bloody or black.  You have bleeding from your rectum. Summary  Diverticulosis is a condition that develops when small pouches (diverticula) form in the wall of the large intestine (colon).  You may have a few pouches or many of them.  This condition is most often  diagnosed during an exam for other colon problems.  If you have had an infection related to diverticulosis, treatment may include increasing the fiber in your diet, taking supplements, or taking medicines. This information is not intended to replace advice given to you by your health care provider. Make sure you discuss any questions you have with your health care provider. Document Released: 05/15/2004 Document Revised: 07/07/2016 Document Reviewed: 07/07/2016 Elsevier Interactive Patient Education  2017 [employer].      Diverticulosis information provided  Begin Benefiber 1 tablespoon twice daily  Pamphlet on hemorrhoid banding provided  Office visit with [place] in 6 weeks.

## 2018-07-21 ENCOUNTER — Encounter (HOSPITAL_COMMUNITY): Payer: Self-pay | Admitting: Internal Medicine

## 2018-08-05 ENCOUNTER — Encounter: Payer: Self-pay | Admitting: Internal Medicine

## 2018-08-09 DIAGNOSIS — M940 Chondrocostal junction syndrome [Tietze]: Secondary | ICD-10-CM | POA: Diagnosis not present

## 2018-09-07 ENCOUNTER — Ambulatory Visit: Payer: BLUE CROSS/BLUE SHIELD | Admitting: Internal Medicine

## 2018-09-10 DIAGNOSIS — I10 Essential (primary) hypertension: Secondary | ICD-10-CM | POA: Diagnosis not present

## 2018-09-10 DIAGNOSIS — Z6839 Body mass index (BMI) 39.0-39.9, adult: Secondary | ICD-10-CM | POA: Diagnosis not present

## 2018-09-10 DIAGNOSIS — L680 Hirsutism: Secondary | ICD-10-CM | POA: Diagnosis not present

## 2018-09-10 DIAGNOSIS — Z0000 Encounter for general adult medical examination without abnormal findings: Secondary | ICD-10-CM | POA: Diagnosis not present

## 2018-09-10 DIAGNOSIS — N926 Irregular menstruation, unspecified: Secondary | ICD-10-CM | POA: Diagnosis not present

## 2018-09-17 ENCOUNTER — Other Ambulatory Visit: Payer: Self-pay | Admitting: Family

## 2018-09-17 DIAGNOSIS — I10 Essential (primary) hypertension: Secondary | ICD-10-CM

## 2018-09-17 DIAGNOSIS — N926 Irregular menstruation, unspecified: Secondary | ICD-10-CM | POA: Diagnosis not present

## 2018-09-17 DIAGNOSIS — L680 Hirsutism: Secondary | ICD-10-CM | POA: Diagnosis not present

## 2018-09-23 DIAGNOSIS — F320 Major depressive disorder, single episode, mild: Secondary | ICD-10-CM | POA: Diagnosis not present

## 2018-09-23 DIAGNOSIS — N926 Irregular menstruation, unspecified: Secondary | ICD-10-CM | POA: Diagnosis not present

## 2018-09-23 DIAGNOSIS — F17210 Nicotine dependence, cigarettes, uncomplicated: Secondary | ICD-10-CM | POA: Diagnosis not present

## 2018-09-23 DIAGNOSIS — M797 Fibromyalgia: Secondary | ICD-10-CM | POA: Diagnosis not present

## 2018-09-23 DIAGNOSIS — I10 Essential (primary) hypertension: Secondary | ICD-10-CM | POA: Diagnosis not present

## 2018-09-23 DIAGNOSIS — Z6839 Body mass index (BMI) 39.0-39.9, adult: Secondary | ICD-10-CM | POA: Diagnosis not present

## 2018-09-23 DIAGNOSIS — Z0001 Encounter for general adult medical examination with abnormal findings: Secondary | ICD-10-CM | POA: Diagnosis not present

## 2018-09-23 DIAGNOSIS — L680 Hirsutism: Secondary | ICD-10-CM | POA: Diagnosis not present

## 2018-10-15 ENCOUNTER — Other Ambulatory Visit: Payer: Self-pay

## 2018-10-15 ENCOUNTER — Encounter (HOSPITAL_COMMUNITY): Payer: Self-pay | Admitting: Emergency Medicine

## 2018-10-15 ENCOUNTER — Emergency Department (HOSPITAL_COMMUNITY)
Admission: EM | Admit: 2018-10-15 | Discharge: 2018-10-15 | Disposition: A | Discharge status: Home / Self Care | Payer: BLUE CROSS/BLUE SHIELD | Attending: Emergency Medicine | Admitting: Emergency Medicine

## 2018-10-15 ENCOUNTER — Emergency Department (HOSPITAL_COMMUNITY): Payer: BLUE CROSS/BLUE SHIELD

## 2018-10-15 DIAGNOSIS — Z79899 Other long term (current) drug therapy: Secondary | ICD-10-CM | POA: Diagnosis not present

## 2018-10-15 DIAGNOSIS — I10 Essential (primary) hypertension: Secondary | ICD-10-CM | POA: Insufficient documentation

## 2018-10-15 DIAGNOSIS — R109 Unspecified abdominal pain: Secondary | ICD-10-CM | POA: Diagnosis not present

## 2018-10-15 DIAGNOSIS — R079 Chest pain, unspecified: Secondary | ICD-10-CM | POA: Diagnosis not present

## 2018-10-15 DIAGNOSIS — M549 Dorsalgia, unspecified: Secondary | ICD-10-CM | POA: Diagnosis not present

## 2018-10-15 DIAGNOSIS — R0789 Other chest pain: Secondary | ICD-10-CM | POA: Diagnosis not present

## 2018-10-15 DIAGNOSIS — Z87891 Personal history of nicotine dependence: Secondary | ICD-10-CM | POA: Diagnosis not present

## 2018-10-15 LAB — COMPREHENSIVE METABOLIC PANEL
ALBUMIN: 3.9 g/dL (ref 3.5–5.0)
ALK PHOS: 69 U/L (ref 38–126)
ALT: 27 U/L (ref 0–44)
AST: 16 U/L (ref 15–41)
Anion gap: 8 (ref 5–15)
BILIRUBIN TOTAL: 0.6 mg/dL (ref 0.3–1.2)
BUN: 12 mg/dL (ref 6–20)
CHLORIDE: 103 mmol/L (ref 98–111)
CO2: 25 mmol/L (ref 22–32)
CREATININE: 0.8 mg/dL (ref 0.44–1.00)
Calcium: 8.5 mg/dL — ABNORMAL LOW (ref 8.9–10.3)
GFR calc non Af Amer: >60 mL/min (ref >60)
Glucose, Bld: 103 mg/dL — ABNORMAL HIGH (ref 70–99)
Potassium: 4 mmol/L (ref 3.5–5.1)
Sodium: 136 mmol/L (ref 135–145)
Total Protein: 7.4 g/dL (ref 6.5–8.1)

## 2018-10-15 LAB — I-STAT BETA HCG BLOOD, ED (MC, WL, AP ONLY): I-stat hCG, quantitative: <5 m[IU]/mL (ref <5)

## 2018-10-15 LAB — CBC WITH DIFFERENTIAL/PLATELET
Abs Immature Granulocytes: 0.03 10*3/uL (ref 0.00–0.07)
Basophils Absolute: 0 10*3/uL (ref 0.0–0.1)
Basophils Relative: 0 %
EOS PCT: 3 %
Eosinophils Absolute: 0.2 10*3/uL (ref 0.0–0.5)
HEMATOCRIT: 41.4 % (ref 36.0–46.0)
HEMOGLOBIN: 13.4 g/dL (ref 12.0–15.0)
Immature Granulocytes: 0 %
LYMPHS PCT: 28 %
Lymphs Abs: 2.1 10*3/uL (ref 0.7–4.0)
MCH: 29.6 pg (ref 26.0–34.0)
MCHC: 32.4 g/dL (ref 30.0–36.0)
MCV: 91.4 fL (ref 80.0–100.0)
Monocytes Absolute: 0.4 10*3/uL (ref 0.1–1.0)
Monocytes Relative: 5 %
Neutro Abs: 4.8 10*3/uL (ref 1.7–7.7)
Neutrophils Relative %: 64 %
Platelets: 278 10*3/uL (ref 150–400)
RBC: 4.53 MIL/uL (ref 3.87–5.11)
RDW: 13 % (ref 11.5–15.5)
WBC: 7.6 10*3/uL (ref 4.0–10.5)
nRBC: 0 % (ref 0.0–0.2)

## 2018-10-15 LAB — TROPONIN I: Troponin I: <0.03 ng/mL (ref <0.03)

## 2018-10-15 LAB — D-DIMER, QUANTITATIVE: D-Dimer, Quant: 0.47 ug/mL-FEU (ref 0.00–0.50)

## 2018-10-15 LAB — LIPASE, BLOOD: Lipase: 21 U/L (ref 11–51)

## 2018-10-15 MED ORDER — ALUM & MAG HYDROXIDE-SIMETH 200-200-20 MG/5ML PO SUSP
30.0000 mL | Freq: Once | ORAL | Status: AC
  Administered 2018-10-15: 30 mL via ORAL
  Filled 2018-10-15: qty 30

## 2018-10-15 MED ORDER — SUCRALFATE 1 G PO TABS: 1.0000 g | ORAL_TABLET | Freq: Three times a day (TID) | ORAL | 0 refills | Status: DC

## 2018-10-15 MED ORDER — LIDOCAINE VISCOUS HCL 2 % MT SOLN
15.0000 mL | Freq: Once | OROMUCOSAL | Status: AC
  Administered 2018-10-15: 15 mL via ORAL
  Filled 2018-10-15: qty 15

## 2018-10-15 NOTE — ED Triage Notes (Signed)
Pain under lt breast that radiates to epigastric area on and off for a week

## 2018-10-15 NOTE — ED Provider Notes (Signed)
[facility] EMERGENCY DEPARTMENT Provider Note   CSN: [id number] Arrival date & time: 10/15/18  1201     History   Chief Complaint Chief Complaint  Patient presents with  . Chest Pain    HPI Tiffany Jackson is a 38 y.o. female.  HPI Patient presents with sharp episodic chest pain for the past week.  States the pain radiates through to her left thoracic back.  Denies shortness of breath.  No nausea vomiting.  No new lower extremity swelling or pain.  No recent extended travel or immobilization.  No known exacerbating or relieving factors for chest pain.  Patient does not smoke.  No first-degree relatives with history of coronary artery disease.  Denies melanotic or grossly bloody stool.  Patient states she did recently have her blood pressure medication changed. Past Medical History:  Diagnosis Date  . Allergy    Seasonal  . Anxiety   . Chicken pox   . Depression   . Fibromyalgia   . Frequent headaches   . GERD (gastroesophageal reflux disease)   . Hypertension   . Migraines     Patient Active Problem List   Diagnosis Date Noted  . GERD (gastroesophageal reflux disease) 06/24/2018  . Rectal bleeding 06/24/2018  . Diarrhea 12/23/2017  . Abdominal pain 12/23/2017  . Vitamin D deficiency 12/30/2016  . Candidiasis of genitalia in female 12/30/2016  . Acute non-recurrent maxillary sinusitis 12/12/2016  . Hemorrhoids 10/09/2016  . Fibromyalgia 04/01/2016  . Benign essential HTN 04/13/2015  . Depression with anxiety 03/30/2015  . Migraines 03/30/2015  . Seasonal allergies 03/30/2015    Past Surgical History:  Procedure Laterality Date  . CESAREAN SECTION     2003, 2005, 2009  . CHOLECYSTECTOMY N/A 04/04/2015   Procedure: LAPAROSCOPIC CHOLECYSTECTOMY;  Surgeon: Natale Lay, MD;  Location: [facility] ORS;  Service: General;  Laterality: N/A;  . COLONOSCOPY WITH PROPOFOL N/A 07/15/2018   Procedure: COLONOSCOPY WITH PROPOFOL;  Surgeon: Corbin Ade, MD;  Location: [facility];   Service: Endoscopy;  Laterality: N/A;  2:00pm     OB History   No obstetric history on file.      Home Medications    Prior to Admission medications   Medication Sig Start Date End Date Taking? Authorizing Provider  ALPRAZolam (XANAX) 0.25 MG tablet TAKE 1 TABLET BY MOUTH TWICE DAILY AS NEEDED FOR ANXIETY Patient taking differently: Take 0.25 mg by mouth 2 (two) times daily as needed for anxiety.  08/23/15  Yes Doss, Oleh Genin, RN  cyclobenzaprine (FLEXERIL) 10 MG tablet  08/11/18  Yes [provider]  diphenoxylate-atropine (LOMOTIL) 2.5-0.025 MG tablet Take 1 tablet by mouth 4 (four) times daily as needed for diarrhea or loose stools. 03/24/18  Yes Anice Paganini, NP  gabapentin (NEURONTIN) 300 MG capsule Take 600 mg by mouth at bedtime.  11/13/17  Yes [provider]  hydrocortisone (ANUSOL-HC) 2.5 % rectal cream Place 1 application rectally 2 (two) times daily. Up to 10 days at a time 06/24/18  Yes Anice Paganini, NP  naproxen (NAPROSYN) 500 MG tablet  08/09/18  Yes [provider]  omeprazole (PRILOSEC) 20 MG capsule Take 20 mg by mouth daily.   Yes [provider]  Probiotic Product (PROBIOTIC DAILY PO) Take 1 tablet by mouth daily.    Yes [provider]  spironolactone (ALDACTONE) 25 MG tablet Take 25 mg by mouth 2 (two) times daily.  09/23/18  Yes [provider]  venlafaxine XR (EFFEXOR-XR) 75 MG 24  hr capsule TAKE 1 CAPSULE DAILY WITH BREAKFAST Patient taking differently: Take 75 mg by mouth at bedtime.  09/22/17  Yes Allegra Grana, FNP  Vitamin D, Cholecalciferol, 1000 UNITS TABS Take 1,000 Units by mouth daily.    Yes [provider]  albuterol (PROVENTIL HFA;VENTOLIN HFA) 108 (90 BASE) MCG/ACT inhaler Inhale 2 puffs into the lungs as needed for wheezing or shortness of breath. Patient not taking: Reported on 07/06/2018 05/01/15   Carollee Leitz, RN  amLODipine (NORVASC) 5 MG tablet TAKE 1 TABLET DAILY Patient not  taking: Reported on 10/15/2018 09/22/17   Allegra Grana, FNP  sucralfate (CARAFATE) 1 g tablet Take 1 tablet (1 g total) by mouth 4 (four) times daily -  with meals and at bedtime. 10/15/18   Loren Racer, MD    Family History Family History  Problem Relation Age of Onset  . Multiple sclerosis Mother   . Hypertension Father   . Cancer Maternal Grandmother 50       Breast Cancer  . Cancer Paternal Grandfather        Lung Cancer  . Anuerysm Paternal Grandmother   . Heart disease Daughter   . Colon cancer Neg Hx   . Gastric cancer Neg Hx   . Esophageal cancer Neg Hx   . Crohn's disease Neg Hx   . Ulcerative colitis Neg Hx     Social History Social History   Tobacco Use  . Smoking status: Former Smoker    Packs/day: 0.50    Years: 12.00    Pack years: 6.00    Types: Cigarettes  . Smokeless tobacco: Never Used  Substance Use Topics  . Alcohol use: No    Alcohol/week: 0.0 standard drinks  . Drug use: No     Allergies   Augmentin [amoxicillin-pot clavulanate]   Review of Systems Review of Systems  Constitutional: Negative for chills and fever.  HENT: Negative for sore throat and trouble swallowing.   Eyes: Negative for visual disturbance.  Respiratory: Negative for cough, shortness of breath and wheezing.   Cardiovascular: Positive for chest pain. Negative for palpitations and leg swelling.  Gastrointestinal: Positive for abdominal pain. Negative for diarrhea, nausea and vomiting.  Genitourinary: Negative for dysuria, flank pain and frequency.  Musculoskeletal: Positive for back pain and myalgias. Negative for neck pain and neck stiffness.  Skin: Negative for rash and wound.  Neurological: Negative for dizziness, weakness, light-headedness, numbness and headaches.  All other systems reviewed and are negative.    Physical Exam Updated Vital Signs BP (!) 148/94 (BP Location: Right Wrist)   Pulse 99   Temp 98.1 F (36.7 C) (Oral)   Resp (!) 22   Ht 5\' 3"   (1.6 m)   Wt 108.9 kg   SpO2 99%   BMI 42.51 kg/m   Physical Exam Vitals signs and nursing note reviewed.  Constitutional:      General: She is not in acute distress.    Appearance: Normal appearance. She is well-developed. She is not ill-appearing.  HENT:     Head: Normocephalic and atraumatic.     Nose: Nose normal.     Mouth/Throat:     Mouth: Mucous membranes are moist.     Pharynx: No oropharyngeal exudate or posterior oropharyngeal erythema.  Eyes:     Extraocular Movements: Extraocular movements intact.     Conjunctiva/sclera: Conjunctivae normal.     Pupils: Pupils are equal, round, and reactive to light.  Neck:     Musculoskeletal: Normal  range of motion and neck supple. Muscular tenderness present. No neck rigidity.     Comments: Patient with left trapezius tenderness to palpation.  No meningismus. Cardiovascular:     Rate and Rhythm: Normal rate and regular rhythm.     Pulses: Normal pulses.     Heart sounds: Normal heart sounds. No murmur. No friction rub. No gallop.   Pulmonary:     Effort: Pulmonary effort is normal. No respiratory distress.     Breath sounds: Normal breath sounds. No stridor. No wheezing, rhonchi or rales.  Chest:     Chest wall: No tenderness.  Abdominal:     General: Bowel sounds are normal.     Palpations: Abdomen is soft.     Tenderness: There is abdominal tenderness. There is no guarding or rebound.     Comments: Epigastric tenderness to palpation.  No rebound or guarding.  Musculoskeletal: Normal range of motion.        General: No swelling, tenderness, deformity or signs of injury.     Right lower leg: No edema.     Left lower leg: No edema.     Comments: No lower extremity swelling, asymmetry or tenderness.  Distal pulses are 2+.  No midline thoracic or lumbar tenderness.  No CVA tenderness.  Lymphadenopathy:     Cervical: No cervical adenopathy.  Skin:    General: Skin is warm and dry.     Capillary Refill: Capillary refill takes  less than 2 seconds.     Findings: No erythema or rash.  Neurological:     General: No focal deficit present.     Mental Status: She is alert and oriented to person, place, and time.  Psychiatric:        Mood and Affect: Mood normal.        Behavior: Behavior normal.      ED Treatments / Results  Labs (all labs ordered are listed, but only abnormal results are displayed) Labs Reviewed  COMPREHENSIVE METABOLIC PANEL - Abnormal; Notable for the following components:      Result Value   Glucose, Bld 103 (*)    Calcium 8.5 (*)    All other components within normal limits  CBC WITH DIFFERENTIAL/PLATELET  LIPASE, BLOOD  D-DIMER, QUANTITATIVE (NOT AT [facility])  TROPONIN I  I-STAT BETA HCG BLOOD, ED ([facility], [facility], [facility] ONLY)    EKG EKG Interpretation  Date/Time:  Friday October 15 2018 12:09:02 EST Ventricular Rate:  93 PR Interval:    QRS Duration: 84 QT Interval:  294 QTC Calculation: 366 R Axis:   46 Text Interpretation:  Sinus rhythm Low voltage, precordial leads Borderline repolarization abnormality Confirmed by Loren RacerYelverton, Domingos Riggi ([id number]) on 10/15/2018 12:15:23 PM   Radiology Dg Chest 2 View  Result Date: 10/15/2018 CLINICAL DATA:  Chest pain EXAM: CHEST - 2 VIEW COMPARISON:  February 08, 2018 FINDINGS: Lungs are clear. The heart size and pulmonary vascularity are normal. No adenopathy. No pneumothorax. No bone lesions. IMPRESSION: No edema or consolidation. Electronically Signed   By: Bretta BangWilliam  Woodruff III M.D.   On: 10/15/2018 13:41    Procedures Procedures (including critical care time)  Medications Ordered in ED Medications  alum & mag hydroxide-simeth (MAALOX/MYLANTA) 200-200-20 MG/5ML suspension 30 mL (30 mLs Oral Given 10/15/18 1415)    And  lidocaine (XYLOCAINE) 2 % viscous mouth solution 15 mL (15 mLs Oral Given 10/15/18 1415)     Initial Impression / Assessment and Plan / ED Course  I have reviewed the triage  vital signs and the nursing notes.  Pertinent labs & imaging  results that were available during my care of the patient were reviewed by me and considered in my medical decision making (see chart for details).     Patient with nonspecific findings on EKG.  Troponin is normal.  Patient also has a normal d-dimer.  Symptoms are atypical for coronary artery disease.  Patient has a heart score of 2.  Chest pain is completely resolved after GI cocktail.  Suspect patient is having reflux disease with possible esophageal spasms.  Will start on Carafate in addition to her PPI.  She is advised to follow-up closely with her gastroenterologist.  However given she does have some atypical findings on her EKG, I have advised her to follow-up with cardiology as well.  Strict return precautions have been given.  Final Clinical Impressions(s) / ED Diagnoses   Final diagnoses:  Atypical chest pain    ED Discharge Orders         Ordered    sucralfate (CARAFATE) 1 g tablet  3 times daily with meals & bedtime     10/15/18 1510           Loren RacerYelverton, Malayah Demuro, MD 10/15/18 1515

## 2018-10-18 DIAGNOSIS — R1012 Left upper quadrant pain: Secondary | ICD-10-CM | POA: Diagnosis not present

## 2018-10-18 DIAGNOSIS — K219 Gastro-esophageal reflux disease without esophagitis: Secondary | ICD-10-CM | POA: Diagnosis not present

## 2018-10-18 DIAGNOSIS — M797 Fibromyalgia: Secondary | ICD-10-CM | POA: Diagnosis not present

## 2018-10-18 DIAGNOSIS — R1011 Right upper quadrant pain: Secondary | ICD-10-CM | POA: Diagnosis not present

## 2018-10-25 ENCOUNTER — Encounter: Payer: Self-pay | Admitting: Nurse Practitioner

## 2018-10-25 ENCOUNTER — Ambulatory Visit (INDEPENDENT_AMBULATORY_CARE_PROVIDER_SITE_OTHER): Payer: BLUE CROSS/BLUE SHIELD | Admitting: Nurse Practitioner

## 2018-10-25 VITALS — BP 125/82 | HR 90 | Temp 97.0°F | Ht 63.0 in | Wt 240.2 lb

## 2018-10-25 DIAGNOSIS — K219 Gastro-esophageal reflux disease without esophagitis: Secondary | ICD-10-CM | POA: Diagnosis not present

## 2018-10-25 DIAGNOSIS — R1013 Epigastric pain: Secondary | ICD-10-CM

## 2018-10-25 DIAGNOSIS — K591 Functional diarrhea: Secondary | ICD-10-CM

## 2018-10-25 NOTE — Patient Instructions (Signed)
Your health issues we discussed today were:   Heartburn (GERD): 1. I have sent a new prescription to your pharmacy to increase omeprazole (Prilosec) to twice a day. 2. Take this for sing in the morning on an empty stomach, and before your last meal the day  Diarrhea: 1. I am glad your symptoms have been doing better 2. Continue Imodium as needed and stool softeners for constipation as needed  Abdominal pain: 1. I feel your abdominal pain is most likely due to your heartburn/GERD flareups 2. Further management to include increasing your acid blocker to twice a day, as stated above 3. Call us for any severe symptoms  Overall I recommend:  1. Follow-up in 2 months 2. Call us if you have any questions or concerns.  At [facility] we value your feedback. You may receive a survey about your visit today. Please share your experience as we strive to create trusting relationships with our patients to provide genuine, compassionate, quality care.  We appreciate your understanding and patience as we review any laboratory studies, imaging, and other diagnostic tests that are ordered as we care for you. Our office policy is 5 business days for review of these results, and any emergent or urgent results are addressed in a timely manner for your best interest. If you do not hear from our office in 1 week, please contact us.   We also encourage the use of MyChart, which contains your medical information for your review as well. If you are not enrolled in this feature, an access code is on this after visit summary for your convenience. Thank you for allowing [place] to be involved in your care.  It was great to see you today!  I hope you have a great day!!

## 2018-10-25 NOTE — Assessment & Plan Note (Signed)
Flare of GERD and epigastric pain symptoms.  She was recently seen in emergency room and cardiac etiology was ruled out.  She had some improvement with GI cocktail.  Carafate prescription did not help.  She is currently on PPI once daily.  At this point I will increase her PPI to twice a day to see if this helps.  We will bring her back for follow-up in 2 months and if no improvement we would likely consider upper endoscopy for further evaluation at that time.

## 2018-10-25 NOTE — Assessment & Plan Note (Signed)
The patient was having epigastric burning and radiating around to the rest of the upper abdomen is a sharp pain.  Her sharp pains have resolved.  Her burning pain is intermittent and likely related to GERD, as per above.  Further GERD management as per above.  Follow-up in 2 months and depending on clinical progression we can decide on next steps to possibly include diagnostic upper endoscopy.

## 2018-10-25 NOTE — Assessment & Plan Note (Signed)
The patient has had diarrhea since cholecystectomy.  Her most recent visit prior to this when she was having some constipation and recommended over-the-counter treatments, stop these if diarrhea recurs.  Her stools have been doing pretty well recently until about 2 or 3 days ago when she began having diarrhea.  She thinks she may have picked up a bug of some sort.  Imodium has helped.  Recommend she continue Imodium as needed and follow-up in 2 months.

## 2018-10-25 NOTE — Progress Notes (Signed)
Referring Provider: Tanna Furry Primary Care Physician:  System, Pcp Not In Primary GI:  Dr. Jena Gauss  Chief Complaint  Patient presents with  . Abdominal Pain    upper abd x 1 1/2 weeks. Went to [facility]  . Diarrhea    x few days about 4-5 times per day. No blood in stool    HPI:   Tiffany Jackson is a 38 y.o. female who presents for diarrhea and abdominal pain.  The patient was last seen in our office 06/24/2018 for hemorrhoids, GERD, rectal pain, epigastric pain.  Previously diagnosed with functional diarrhea.  Chronic diarrhea since cholecystectomy 2 years prior which is persistent despite cholestyramine with large arm mucousy diarrhea up to 8 times a day.  GI pathogen panel normal, CBC and specifically white blood cell count normal.  Noticed worsening with cheese and query lactose issue.  Eliminating dairy did help her symptoms some but she stated it was "hard to do because she loves cheese so much."  Bentyl did not help.  Known hiatal hernia and hospital admission for abdominal pain treated for reflux with noted improvement after switching from Zantac to omeprazole.  Often forgets to take her medications.    Previously recommended a colonoscopy which she canceled.  At her last visit she started having rectal bleeding intermittently describes a moderate amount associated with abdominal cramping and resolution of cramping after bowel movement.  Known external hemorrhoid and she felt this was flare at the time.  Topical treatment over-the-counter was ineffective.  Epigastric pain significantly improved with better adherence to antireflux measures.  No other GI complaints.  Recommended continue current medications, Anusol topical cream, colonoscopy, follow-up in 4 months.  Colonoscopy is completed 07/15/2018 which found nonbleeding internal hemorrhoids, otherwise normal exam.  Diverticulosis in the entire examined colon.  Recommended next colonoscopy at age 20 for screening purposes,  follow-up in 6 weeks, begin Benefiber 1 tablespoon twice daily, hemorrhoid banding pamphlet was provided.  The patient was recently in the emergency room for chest pain.  They elicited epigastric pain on her exam.  Cardiac work-up was normal.  Symptoms resolved with GI cocktail.  She was prescribed Carafate in addition to her PPI and recommended follow-up with GI.  Today she states she's doing ok overall. Had a flare of GERD and epigastric pain, Bentyl didn't help. Tried Carafate but this didn't help. Still on Prilosec once daily, in the morning. No known triggers of GERD. Symptoms more often occur in the morning after she's slept. Pain is epihastric and radiates over the upper abdomen, described as burning epigastric and sharp the rest of the upper abdomen; lasted for 3 days. GI cocktail helped the epigastric symptoms. Sharp pain has resolved, epigastric burning intermittent, abdominal flexion (as when sitting up) tends to make pain worse. Denies hematochezia, melena, fever, chills, unintentional weight loss. Denies chest pain, dyspnea, dizziness, lightheadedness, syncope, near syncope. Denies any other upper or lower GI symptoms.  Stools have been well controlled. Started with recurrent diarrhea 2-3 days ago, thinks she may have caught "a bug." Imodium has helped.  Past Medical History:  Diagnosis Date  . Allergy    Seasonal  . Anxiety   . Chicken pox   . Depression   . Fibromyalgia   . Frequent headaches   . GERD (gastroesophageal reflux disease)   . Hypertension   . Migraines     Past Surgical History:  Procedure Laterality Date  . CESAREAN SECTION     2003, 2005, 2009  .  CHOLECYSTECTOMY N/A 04/04/2015   Procedure: LAPAROSCOPIC CHOLECYSTECTOMY;  Surgeon: Natale Lay, MD;  Location: [facility] ORS;  Service: General;  Laterality: N/A;  . COLONOSCOPY WITH PROPOFOL N/A 07/15/2018   Procedure: COLONOSCOPY WITH PROPOFOL;  Surgeon: Corbin Ade, MD;  Location: [facility];  Service: Endoscopy;   Laterality: N/A;  2:00pm    Current Outpatient Medications  Medication Sig Dispense Refill  . albuterol (PROVENTIL HFA;VENTOLIN HFA) 108 (90 BASE) MCG/ACT inhaler Inhale 2 puffs into the lungs as needed for wheezing or shortness of breath. 1 Inhaler 1  . ALPRAZolam (XANAX) 0.25 MG tablet TAKE 1 TABLET BY MOUTH TWICE DAILY AS NEEDED FOR ANXIETY (Patient taking differently: Take 0.25 mg by mouth 2 (two) times daily as needed for anxiety. ) 60 tablet 0  . baclofen (LIORESAL) 5 mg TABS tablet Take 5 mg by mouth 2 (two) times daily.    . cyclobenzaprine (FLEXERIL) 10 MG tablet as needed.     . diphenoxylate-atropine (LOMOTIL) 2.5-0.025 MG tablet Take 1 tablet by mouth 4 (four) times daily as needed for diarrhea or loose stools. 30 tablet 0  . gabapentin (NEURONTIN) 300 MG capsule Take 600 mg by mouth at bedtime.     . hydrocortisone (ANUSOL-HC) 2.5 % rectal cream Place 1 application rectally 2 (two) times daily. Up to 10 days at a time (Patient taking differently: Place 1 application rectally as needed. Up to 10 days at a time) 30 g 1  . naproxen (NAPROSYN) 500 MG tablet as needed.     Marland Kitchen omeprazole (PRILOSEC) 20 MG capsule Take 20 mg by mouth daily.    . Probiotic Product (PROBIOTIC DAILY PO) Take 1 tablet by mouth daily.     Marland Kitchen spironolactone (ALDACTONE) 25 MG tablet Take 25 mg by mouth 2 (two) times daily.     Marland Kitchen venlafaxine XR (EFFEXOR-XR) 75 MG 24 hr capsule TAKE 1 CAPSULE DAILY WITH BREAKFAST (Patient taking differently: Take 75 mg by mouth at bedtime. ) 90 capsule 3  . Vitamin D, Cholecalciferol, 1000 UNITS TABS Take 1,000 Units by mouth daily.      No current facility-administered medications for this visit.     Allergies as of 10/25/2018 - Review Complete 10/25/2018  Allergen Reaction Noted  . Augmentin [amoxicillin-pot clavulanate] Other (See Comments) 12/30/2016    Family History  Problem Relation Age of Onset  . Multiple sclerosis Mother   . Hypertension Father   . Cancer Maternal  Grandmother 50       Breast Cancer  . Cancer Paternal Grandfather        Lung Cancer  . Anuerysm Paternal Grandmother   . Heart disease Daughter   . Colon cancer Neg Hx   . Gastric cancer Neg Hx   . Esophageal cancer Neg Hx   . Crohn's disease Neg Hx   . Ulcerative colitis Neg Hx     Social History   Socioeconomic History  . Marital status: Married    Spouse name: Not on file  . Number of children: Not on file  . Years of education: Not on file  . Highest education level: Not on file  Occupational History  . Not on file  Social Needs  . Financial resource strain: Not on file  . Food insecurity:    Worry: Not on file    Inability: Not on file  . Transportation needs:    Medical: Not on file    Non-medical: Not on file  Tobacco Use  . Smoking status: Former Smoker  Packs/day: 0.50    Years: 12.00    Pack years: 6.00    Types: Cigarettes  . Smokeless tobacco: Never Used  Substance and Sexual Activity  . Alcohol use: No    Alcohol/week: 0.0 standard drinks  . Drug use: No  . Sexual activity: Yes    Partners: Male    Birth control/protection: Surgical    Comment: Husband  Lifestyle  . Physical activity:    Days per week: Not on file    Minutes per session: Not on file  . Stress: Not on file  Relationships  . Social connections:    Talks on phone: Not on file    Gets together: Not on file    Attends religious service: Not on file    Active member of club or organization: Not on file    Attends meetings of clubs or organizations: Not on file    Relationship status: Not on file  Other Topics Concern  . 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 ENT: Negative for hoarseness, difficulty swallowing. CV: Negative for chest  pain, angina, palpitations, peripheral edema.  Respiratory: Negative for dyspnea at rest, cough, sputum, wheezing.  GI: See history of present illness. Endo: Negative for unusual weight change.  Heme: Negative for bruising or bleeding. Allergy: Negative for rash or hives.   Physical Exam: BP 125/82   Pulse 90   Temp (!) 97 F (36.1 C) (Oral)   Ht  (1.6 m)   Wt 240 lb 3.2 oz (109 kg)   LMP 09/20/2018 (Approximate)   BMI 42.55 kg/m  General:   Alert and oriented. Pleasant and cooperative. Well-nourished and well-developed.  Eyes:  Without icterus, sclera clear and conjunctiva pink.  Ears:  Normal auditory acuity. Cardiovascular:  S1, S2 present without murmurs appreciated. Extremities without clubbing or edema. Respiratory:  Clear to auscultation bilaterally. No wheezes, rales, or rhonchi. No distress.  Gastrointestinal:  +BS, soft, non-tender and non-distended. No HSM noted. No guarding or rebound. No masses appreciated.  Rectal:  Deferred  Musculoskalatal:  Symmetrical without gross deformities. Neurologic:  Alert and oriented x4;  grossly normal neurologically. Psych:  Alert and cooperative. Normal mood and affect. Heme/Lymph/Immune: No excessive bruising noted.    10/25/2018 9:37 AM   Disclaimer: This note was dictated with voice recognition software. Similar sounding words can inadvertently be transcribed and may not be corrected upon review.

## 2018-12-28 DIAGNOSIS — M797 Fibromyalgia: Secondary | ICD-10-CM | POA: Diagnosis not present

## 2018-12-28 DIAGNOSIS — I10 Essential (primary) hypertension: Secondary | ICD-10-CM | POA: Diagnosis not present

## 2018-12-28 DIAGNOSIS — L680 Hirsutism: Secondary | ICD-10-CM | POA: Diagnosis not present

## 2018-12-28 DIAGNOSIS — G4700 Insomnia, unspecified: Secondary | ICD-10-CM | POA: Diagnosis not present

## 2018-12-28 DIAGNOSIS — F320 Major depressive disorder, single episode, mild: Secondary | ICD-10-CM | POA: Diagnosis not present

## 2018-12-29 ENCOUNTER — Other Ambulatory Visit: Payer: Self-pay

## 2018-12-29 ENCOUNTER — Ambulatory Visit (INDEPENDENT_AMBULATORY_CARE_PROVIDER_SITE_OTHER): Payer: BLUE CROSS/BLUE SHIELD | Admitting: Nurse Practitioner

## 2018-12-29 ENCOUNTER — Encounter: Payer: Self-pay | Admitting: Internal Medicine

## 2018-12-29 ENCOUNTER — Encounter: Payer: Self-pay | Admitting: Nurse Practitioner

## 2018-12-29 DIAGNOSIS — K219 Gastro-esophageal reflux disease without esophagitis: Secondary | ICD-10-CM | POA: Diagnosis not present

## 2018-12-29 DIAGNOSIS — K591 Functional diarrhea: Secondary | ICD-10-CM

## 2018-12-29 DIAGNOSIS — K649 Unspecified hemorrhoids: Secondary | ICD-10-CM

## 2018-12-29 DIAGNOSIS — R1013 Epigastric pain: Secondary | ICD-10-CM

## 2018-12-29 NOTE — Progress Notes (Signed)
CC'ED TO [facility]

## 2018-12-29 NOTE — Patient Instructions (Signed)
Your health issues we discussed today were:   Hemorrhoids: 1. I will discuss with 1 of the nurse practitioners in our office who does hemorrhoid banding to get you scheduled for an appointment 2. Continue topical therapy in the interim 3. Call with any severe or worsening symptoms  Abdominal pain and gas: 1. Continue using Phazyme as needed to help with your gas 2. Try to cut back on the amount of sodas you drink.  Avoid cruciferous vegetables 3. Call for any worsening or severe symptoms  Diarrhea: 1. Glad your symptoms are better.  You can use Imodium over-the-counter as needed intermittently 2. Call if you have any severe or worsening of your symptoms  Overall I recommend:  1. Continue your current medications 2. Call us if you have any questions or concerns 3. Follow-up in 6 months.   Because of recent events of COVID-19 ("Coronavirus"), follow CDC recommendations:  1. Wash your hand frequently 2. Avoid touching your face 3. Stay away from people who are sick 4. If you have symptoms such as fever, cough, shortness of breath then call your healthcare provider for further guidance 5. If you are sick, STAY AT HOME unless otherwise directed by your healthcare provider. 6. Follow directions from state and national officials regarding staying safe   At [facility] we value your feedback. You may receive a survey about your visit today. Please share your experience as we strive to create trusting relationships with our patients to provide genuine, compassionate, quality care.  We appreciate your understanding and patience as we review any laboratory studies, imaging, and other diagnostic tests that are ordered as we care for you. Our office policy is 5 business days for review of these results, and any emergent or urgent results are addressed in a timely manner for your best interest. If you do not hear from our office in 1 week, please contact us.   We also encourage  the use of MyChart, which contains your medical information for your review as well. If you are not enrolled in this feature, an access code is on this after visit summary for your convenience. Thank you for allowing [place] to be involved in your care.  It was great to see you today!  I hope you have a great day!!

## 2018-12-29 NOTE — Assessment & Plan Note (Signed)
GERD doing well currently on Prilosec.  Recommend she continue her current PPI and follow-up as needed.

## 2018-12-29 NOTE — Assessment & Plan Note (Signed)
Diarrhea is significantly improved, although not completely resolved.  She has made significant efforts to alter her diet.  She is avoiding greasy and fast foods and this is made a difference for her.  She is no longer requiring intermittent mitten and Imodium.  Recommend she continue her current medications, continue with her dietary changes and follow-up in 6 months.

## 2018-12-29 NOTE — Assessment & Plan Note (Signed)
Some persistent abdominal pain and she attributes this to gas.  Gas-X did not help, but Phazyme has helped some.  Recommend she could also try Beano.  Also recommend she cut back on her carbonated beverage intake, which she states is substantial.  Can also avoid gas causing foods such as beans and cruciferous vegetables.  She states she would investigate a list of gas causing foods and try to avoid those.  Follow-up in 6 months.

## 2018-12-29 NOTE — Assessment & Plan Note (Signed)
Documented internal hemorrhoids on recent colonoscopy.  Topical therapy is no longer helping and she is interested in hemorrhoid banding.  We will reach out to providers in our office to do hemorrhoid banding to try to get her established and set up for an appointment with scheduling pending progression of the COVID-19/coronavirus pandemic.  In the meantime continue current medications and follow-up in general in 6 months.

## 2018-12-29 NOTE — Progress Notes (Signed)
Referring Provider: No ref. provider found Primary Care Physician:  System, Pcp Not In Primary GI:  Dr. Jena Gauss  NOTE: Service was provided via telemedicine and was requested by the patient due to COVID-19 pandemic.  Method of visit: FaceTime  Patient Location: Home  Provider Location: Office  Reason for Phone Visit: Follow-up  The patient was consented to phone follow-up via telephone encounter including billing of the encounter (yes/no): Yes  Persons present on the phone encounter, with roles: None  Total time (minutes) spent on medical discussion: 12 minutes  Chief Complaint  Patient presents with  . Diarrhea    Abd pain,Gas pain    HPI:   Tiffany Jackson is a 38 y.o. female who presents for virtual visit regarding: Follow-up on diarrhea and abdominal pain.  The patient was last seen in our office 10/25/2018 for GERD, epigastric pain, functional diarrhea.  Diarrhea since cholecystectomy 2 years prior despite cholestyramine, was having morning time mucousy diarrhea up to 8 times a day with a normal GI pathogen panel and normal CBC.  Noted worsening with cheese and query lactose issue eliminating diet did help her symptoms but "hard to do because I love cheese so much."  Bentyl not effective.  Often forgets to take her medications.  Colonoscopy up-to-date with recommended repeat exam at age 82 for screening purposes, begin Benefiber twice daily, hemorrhoid banding pamphlet provided.  At her last visit she noted recent flare of GERD and epigastric pain and Bentyl did not help nor did Carafate.  Is on Prilosec once daily and no identified specific triggers of her GERD symptoms, although they are typically in the morning.  Epigastric pain is burning and sharp.  GI cocktail helped with resolution of sharp pain but persistent intermittent burning.  Abdominal flexion worsening symptoms.  No other GI complaints.  Overall noted stools have been well controlled and although she had recurrent  diarrhea 2 to 3 days prior thinks she may have caught a "bug" that Imodium has helped.  Recommended omeprazole twice daily, continue Imodium as needed, follow-up in 2 months.  Today she states she's doing ok overall. Diarrhea is significantly better. Still intermittent but much less frequent. Has been changing her diet for less fast food and greasy food. Still with abdominal pain, thinks it's more gas-related. GasX doesn't help much. Has tried Phazyme which helps some. She does drink a lot of sodas. Denies hematochezia, melena, fever, chills, unintentional weight loss. Is having some hemorrhoid symptoms with her hemorrhoid, topical therapy no longer effective. GERD well managed on prilosec. Denies URI and flu-like symptoms. Denies chest pain, dyspnea, dizziness, lightheadedness, syncope, near syncope. Denies any other upper or lower GI symptoms.  Past Medical History:  Diagnosis Date  . Allergy    Seasonal  . Anxiety   . Chicken pox   . Depression   . Fibromyalgia   . Frequent headaches   . GERD (gastroesophageal reflux disease)   . Hypertension   . Migraines     Past Surgical History:  Procedure Laterality Date  . CESAREAN SECTION     2003, 2005, 2009  . CHOLECYSTECTOMY N/A 04/04/2015   Procedure: LAPAROSCOPIC CHOLECYSTECTOMY;  Surgeon: Natale Lay, MD;  Location: [facility] ORS;  Service: General;  Laterality: N/A;  . COLONOSCOPY WITH PROPOFOL N/A 07/15/2018   Procedure: COLONOSCOPY WITH PROPOFOL;  Surgeon: Corbin Ade, MD;  Location: [facility];  Service: Endoscopy;  Laterality: N/A;  2:00pm    Current Outpatient Medications  Medication Sig Dispense Refill  .  albuterol (PROVENTIL HFA;VENTOLIN HFA) 108 (90 BASE) MCG/ACT inhaler Inhale 2 puffs into the lungs as needed for wheezing or shortness of breath. 1 Inhaler 1  . ALPRAZolam (XANAX) 0.25 MG tablet TAKE 1 TABLET BY MOUTH TWICE DAILY AS NEEDED FOR ANXIETY (Patient taking differently: Take 0.25 mg by mouth 2 (two) times daily as needed  for anxiety. ) 60 tablet 0  . baclofen (LIORESAL) 5 mg TABS tablet Take 5 mg by mouth 2 (two) times daily.    . cyclobenzaprine (FLEXERIL) 10 MG tablet as needed.     . DULoxetine (CYMBALTA) 30 MG capsule Take 30 mg by mouth daily.    . naproxen (NAPROSYN) 500 MG tablet as needed.     Marland Kitchen omeprazole (PRILOSEC) 20 MG capsule Take 20 mg by mouth daily.    . Probiotic Product (PROBIOTIC DAILY PO) Take 1 tablet by mouth daily.     Marland Kitchen spironolactone (ALDACTONE) 25 MG tablet Take 25 mg by mouth 2 (two) times daily.     Marland Kitchen venlafaxine XR (EFFEXOR-XR) 75 MG 24 hr capsule TAKE 1 CAPSULE DAILY WITH BREAKFAST (Patient taking differently: Take 37.5 mg by mouth at bedtime. ) 90 capsule 3  . Vitamin D, Cholecalciferol, 1000 UNITS TABS Take 1,000 Units by mouth daily.      No current facility-administered medications for this visit.     Allergies as of 12/29/2018 - Review Complete 12/29/2018  Allergen Reaction Noted  . Augmentin [amoxicillin-pot clavulanate] Other (See Comments) 12/30/2016    Family History  Problem Relation Age of Onset  . Multiple sclerosis Mother   . Hypertension Father   . Cancer Maternal Grandmother 50       Breast Cancer  . Cancer Paternal Grandfather        Lung Cancer  . Anuerysm Paternal Grandmother   . Heart disease Daughter   . Colon cancer Neg Hx   . Gastric cancer Neg Hx   . Esophageal cancer Neg Hx   . Crohn's disease Neg Hx   . Ulcerative colitis Neg Hx     Social History   Socioeconomic History  . Marital status: Married    Spouse name: Not on file  . Number of children: Not on file  . Years of education: Not on file  . Highest education level: Not on file  Occupational History  . Not on file  Social Needs  . Financial resource strain: Not on file  . Food insecurity:    Worry: Not on file    Inability: Not on file  . Transportation needs:    Medical: Not on file    Non-medical: Not on file  Tobacco Use  . Smoking status: Former Smoker     Packs/day: 0.50    Years: 12.00    Pack years: 6.00    Types: Cigarettes  . Smokeless tobacco: Never Used  Substance and Sexual Activity  . Alcohol use: No    Alcohol/week: 0.0 standard drinks  . Drug use: No  . Sexual activity: Yes    Partners: Male    Birth control/protection: Surgical    Comment: Husband  Lifestyle  . Physical activity:    Days per week: Not on file    Minutes per session: Not on file  . Stress: Not on file  Relationships  . Social connections:    Talks on phone: Not on file    Gets together: Not on file    Attends religious service: Not on file    Active member of  club or organization: Not on file    Attends meetings of clubs or organizations: Not on file    Relationship status: Not on file  Other Topics Concern  . 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 ENT: Negative for hoarseness, difficulty swallowing , nasal congestion. CV: Negative for chest pain, angina, palpitations, peripheral edema.  Respiratory: Negative for dyspnea at rest, cough, sputum, wheezing.  GI: See history of present illness. Endo: Negative for unusual weight change.  Heme: Negative for bruising or bleeding. Allergy: Negative for rash or hives.  Physical Exam: Note: limited exam due to virtual visit General:   Alert and oriented. Pleasant and cooperative. Well-nourished and well-developed.  Head:  Normocephalic and atraumatic. Eyes:  Without icterus, sclera clear and conjunctiva pink.  Ears:  Normal auditory acuity. Skin:  Intact without facial significant lesions or rashes. Neurologic:  Alert and oriented x4;  grossly normal neurologically. Psych:  Alert and cooperative. Normal mood and affect. Heme/Lymph/Immune: No excessive bruising  noted.

## 2019-02-23 ENCOUNTER — Encounter: Payer: BLUE CROSS/BLUE SHIELD | Admitting: Gastroenterology

## 2019-04-22 ENCOUNTER — Ambulatory Visit: Payer: BLUE CROSS/BLUE SHIELD | Admitting: Gastroenterology

## 2019-04-22 ENCOUNTER — Encounter: Payer: BLUE CROSS/BLUE SHIELD | Admitting: Gastroenterology

## 2019-05-18 ENCOUNTER — Ambulatory Visit (INDEPENDENT_AMBULATORY_CARE_PROVIDER_SITE_OTHER): Payer: Self-pay | Admitting: Gastroenterology

## 2019-05-18 ENCOUNTER — Encounter: Payer: Self-pay | Admitting: Gastroenterology

## 2019-05-18 ENCOUNTER — Other Ambulatory Visit: Payer: Self-pay

## 2019-05-18 VITALS — BP 149/94 | HR 96 | Temp 97.1°F | Ht 63.0 in | Wt 245.0 lb

## 2019-05-18 DIAGNOSIS — K529 Noninfective gastroenteritis and colitis, unspecified: Secondary | ICD-10-CM | POA: Insufficient documentation

## 2019-05-18 DIAGNOSIS — K642 Third degree hemorrhoids: Secondary | ICD-10-CM

## 2019-05-18 MED ORDER — COLESTIPOL HCL 1 G PO TABS: 2.0000 g | ORAL_TABLET | Freq: Every day | ORAL | 3 refills | Status: DC

## 2019-05-18 NOTE — Progress Notes (Signed)
[facility] Banding Note:   38 year old female presenting with chronic symptomatic hemorrhoids for at least 10 years, s/p colonoscopy Nov 2019: non-bleeding internal hemorrhoids, diverticulosis in entire examined colon. She has had chronic diarrhea s/p cholecystectomy. Unable to eat out without fecal urgency. Failed Bentyl in the past. Has not tried Colestid.   The patient presents with symptomatic grade 3 hemorrhoids, unresponsive to maximal medical therapy, requesting rubber band ligation of her hemorrhoidal disease. All risks, benefits, and alternative forms of therapy were described and informed consent was obtained.  The decision was made to band the left lateral internal hemorrhoid, and the [place] was used to perform band ligation without complication. Digital anorectal examination was then performed to assure proper positioning of the band, and to adjust the banded tissue as required. The patient was discharged home without pain or other issues. Dietary and behavioral recommendations were given, including prescription for Colestid 2 grams daily to trial.  The patient will return in 2-3 weeks for followup and possible additional banding as required.  No complications were encountered and the patient tolerated the procedure well.  Annitta Needs, PhD, ANP-BC [facility] Gastroenterology

## 2019-05-18 NOTE — Patient Instructions (Signed)
I have sent in Colestid tablets. Take 2 grams each morning (this should be 2 tablets). You can do this twice a day if needed. I would start out just once a day. Monitor for constipation. If this happens, decrease the dosage. We want to avoid straining.  Limit your toilet time to 2-3 minutes.  I will see you in 2-3 weeks for repeat banding!  It was a pleasure to see you today. I want to create trusting relationships with patients to provide genuine, compassionate, and quality care. I value your feedback. If you receive a survey regarding your visit,  I greatly appreciate you taking time to fill this out.   Annitta Needs, PhD, ANP-BC [facility] Gastroenterology

## 2019-06-14 ENCOUNTER — Other Ambulatory Visit: Payer: Self-pay

## 2019-06-14 ENCOUNTER — Ambulatory Visit: Payer: BC Managed Care – PPO | Admitting: Gastroenterology

## 2019-06-14 ENCOUNTER — Encounter: Payer: Self-pay | Admitting: Gastroenterology

## 2019-06-14 VITALS — BP 134/92 | HR 99 | Temp 96.9°F | Ht 63.0 in | Wt 245.0 lb

## 2019-06-14 DIAGNOSIS — K642 Third degree hemorrhoids: Secondary | ICD-10-CM

## 2019-06-14 NOTE — Patient Instructions (Signed)
I am so glad the Colestid has worked well for you! Continue taking daily to every other day. Monitor for constipation.   I will see you in 2-3 weeks.   Please call with any concerns in the meantime!  I enjoyed seeing you again today! As you know, I value our relationship and want to provide genuine, compassionate, and quality care. I welcome your feedback. If you receive a survey regarding your visit,  I greatly appreciate you taking time to fill this out. See you next time!  Annitta Needs, PhD, ANP-BC [facility] Gastroenterology

## 2019-06-14 NOTE — Progress Notes (Signed)
[place] Banding Note  38 year old female presenting with chronic symptomatic hemorrhoids for at least 10 years, s/p colonoscopy Nov 2019: non-bleeding internal hemorrhoids, diverticulosis in entire examined colon. She has had chronic diarrhea s/p cholecystectomy. Unable to eat out without fecal urgency. She has failed Bentyl in the past, and she was provided with Colestid at the last appointment. First banding Sept 2020 of left lateral internal hemorrhoid. 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 BID was too strong, so she has decreased to once per day. Soft BM daily, no straining. She still feels like the left side is causing issues and feels more prominent tissue there. Able to reduce manually.   The patient presents with symptomatic grade 3 hemorrhoids, unresponsive to maximal medical therapy, requesting rubber band ligation of her hemorrhoidal disease. All risks, benefits, and alternative forms of therapy were described and informed consent was obtained.  Initially, the decision was made to band the right posterior internal hemorrhoid, and the [place] was used to perform band ligation without complication. This band did not take, as column was small. Attention was turned again towards the left lateral hemorrhoid, which seems to be the most bothersome to her. The ligator was reloaded, and the left lateral hemorrhoid was banded. Excellent position with still a good amount of tissue palpated but proper position of band noted.  The patient was discharged home without pain or other issues. Dietary and behavioral recommendations were given, along with follow-up instructions. I suspect she will now notice more significant improvement, as the left lateral column has been most troublesome. The patient will return in 2-3 weks for followup and possible additional banding as required. Will likely perform anoscopy again at that time.   No complications were encountered and the patient tolerated the procedure  well.  Annitta Needs, PhD, ANP-BC [facility] Gastroenterology

## 2019-06-27 NOTE — Telephone Encounter (Signed)
Doris: can we find out which [facility] on [address] [facility]? Several ones listed.

## 2019-07-05 ENCOUNTER — Encounter: Payer: Self-pay | Admitting: Gastroenterology

## 2019-07-05 ENCOUNTER — Ambulatory Visit: Payer: BC Managed Care – PPO | Admitting: Gastroenterology

## 2019-07-05 ENCOUNTER — Other Ambulatory Visit: Payer: Self-pay

## 2019-07-05 VITALS — BP 148/98 | HR 96 | Temp 97.1°F | Ht 63.0 in | Wt 244.4 lb

## 2019-07-05 DIAGNOSIS — K641 Second degree hemorrhoids: Secondary | ICD-10-CM

## 2019-07-05 MED ORDER — COLESTIPOL HCL 1 G PO TABS: 1.0000 g | ORAL_TABLET | Freq: Every day | ORAL | 3 refills | Status: DC

## 2019-07-05 NOTE — Patient Instructions (Signed)
I have refilled Colestid for you.   We will see you in 2-3 weeks!  Please call with any problems in the interim!  I enjoyed seeing you again today! As you know, I value our relationship and want to provide genuine, compassionate, and quality care. I welcome your feedback. If you receive a survey regarding your visit,  I greatly appreciate you taking time to fill this out. See you next time!  Annitta Needs, PhD, ANP-BC [facility] Gastroenterology

## 2019-07-05 NOTE — Progress Notes (Signed)
[facility] Banding Note:    38 year old female presenting today with history of chronic hemorrhoids, symptomatic for last 10 years, s/p colonoscopyNov 2019: non-bleeding internal hemorrhoids, diverticulosis in entire examined colon.She has had chronic diarrhea s/p cholecystectomy. Unable to eat out without fecal urgency. She has failed Bentyl in the past, and she was provided with Colestid, which has been helpful. Thus far, has undergone banding of left lateral X2, as this was the largest and did well with serial banding. She has noticed improvement since last banding. Itching and burning only once. Able to have BMs easier. No rectal bleeding.   The patient presents with symptomatic grade 2 hemorrhoids, unresponsive to maximal medical therapy, requesting rubber band ligation of his/her hemorrhoidal disease. All risks, benefits, and alternative forms of therapy were described and informed consent was obtained.  The decision was made to band the right anterior internal hemorrhoid, and the [place] was used to perform band ligation without complication. Digital anorectal examination was then performed to assure proper positioning of the band, and no adjustment was required. The patient was discharged home without pain or other issues. Dietary and behavioral recommendations were given and (if necessary prescriptions were given), along with follow-up instructions. The patient will return in 2-3 weeks for followup and possible additional banding as required. She may do best with final banding in the "neutral" position. Right posterior is not impressive and was not amenable to banding several weeks ago.   No complications were encountered and the patient tolerated the procedure well.  Tiffany Needs, PhD, ANP-BC [facility] Gastroenterology

## 2019-07-21 ENCOUNTER — Ambulatory Visit: Payer: BC Managed Care – PPO | Admitting: Gastroenterology

## 2019-07-21 ENCOUNTER — Encounter: Payer: Self-pay | Admitting: Gastroenterology

## 2019-07-21 ENCOUNTER — Other Ambulatory Visit: Payer: Self-pay

## 2019-07-21 VITALS — BP 127/84 | HR 95 | Temp 97.2°F | Ht 63.0 in | Wt 243.6 lb

## 2019-07-21 DIAGNOSIS — K642 Third degree hemorrhoids: Secondary | ICD-10-CM | POA: Insufficient documentation

## 2019-07-21 NOTE — Patient Instructions (Signed)
So glad you are doing well!  We will see you again in 6 months unless you have concerns before then.  Continue to limit toilet time, continue Colestid, and avoid straining.  Have a wonderful holiday season!  I enjoyed seeing you again today! As you know, I value our relationship and want to provide genuine, compassionate, and quality care. I welcome your feedback. If you receive a survey regarding your visit,  I greatly appreciate you taking time to fill this out. See you next time!  Annitta Needs, PhD, ANP-BC [facility] Gastroenterology

## 2019-07-21 NOTE — Progress Notes (Signed)
38 year old female presenting today with history of chronic hemorrhoids, symptomatic for last 10 years, s/p colonoscopyNov 2019: non-bleeding internal hemorrhoids, diverticulosis in entire examined colon.She has had chronic diarrhea s/p cholecystectomy. Unable to eat out without fecal urgency. She has failed Bentyl in the past, and she was provided with Colestid, which has been helpful. Thus far, has undergone banding of left lateral X2 and right anterior. Right posterior was not impressive on anoscopy and not amenable to banding several weeks ago.  She does have an external skin tag that she can feel at times, but she has no further rectal bleeding, itching, burning, soiling.    The patient presents with symptomatic grade 2-3  hemorrhoids, unresponsive to maximal medical therapy, requesting rubber band ligation of his/her hemorrhoidal disease. All risks, benefits, and alternative forms of therapy were described and informed consent was obtained.   The decision was made to band in the neutral position.[place] was used to perform band ligation without complication. Digital anorectal examination was then performed to assure proper positioning of the band, and no adjustment was required. The patient was discharged home without pain or other issues. Dietary and behavioral recommendations were given,  along with follow-up instructions. The patient will return in 6 months for routine follow-up.   No complications were encountered and the patient tolerated the procedure well  Tiffany Needs, PhD, [facility] Gastroenterology

## 2019-11-30 DIAGNOSIS — J45909 Unspecified asthma, uncomplicated: Secondary | ICD-10-CM | POA: Diagnosis not present

## 2019-11-30 DIAGNOSIS — Z8616 Personal history of COVID-19: Secondary | ICD-10-CM | POA: Diagnosis not present

## 2019-11-30 DIAGNOSIS — I10 Essential (primary) hypertension: Secondary | ICD-10-CM | POA: Diagnosis not present

## 2019-11-30 DIAGNOSIS — L680 Hirsutism: Secondary | ICD-10-CM | POA: Diagnosis not present

## 2019-12-16 DIAGNOSIS — F320 Major depressive disorder, single episode, mild: Secondary | ICD-10-CM | POA: Diagnosis not present

## 2019-12-19 DIAGNOSIS — F320 Major depressive disorder, single episode, mild: Secondary | ICD-10-CM | POA: Diagnosis not present

## 2019-12-19 DIAGNOSIS — F4312 Post-traumatic stress disorder, chronic: Secondary | ICD-10-CM | POA: Diagnosis not present

## 2020-01-18 ENCOUNTER — Other Ambulatory Visit: Payer: Self-pay

## 2020-01-18 ENCOUNTER — Telehealth (INDEPENDENT_AMBULATORY_CARE_PROVIDER_SITE_OTHER): Payer: BC Managed Care – PPO | Admitting: Gastroenterology

## 2020-01-18 ENCOUNTER — Encounter: Payer: Self-pay | Admitting: Gastroenterology

## 2020-01-18 DIAGNOSIS — K219 Gastro-esophageal reflux disease without esophagitis: Secondary | ICD-10-CM

## 2020-01-18 DIAGNOSIS — K642 Third degree hemorrhoids: Secondary | ICD-10-CM | POA: Diagnosis not present

## 2020-01-18 NOTE — Patient Instructions (Signed)
We will see you back in 1 year or sooner if needed!  Please call if any concerns in the meantime. Continue to limit toilet time to 2-3 minutes, avoid straining.   I enjoyed seeing you again today! As you know, I value our relationship and want to provide genuine, compassionate, and quality care. I welcome your feedback. If you receive a survey regarding your visit,  I greatly appreciate you taking time to fill this out. See you next time!  Gelene Mink, PhD, ANP-BC [facility] Gastroenterology

## 2020-01-18 NOTE — Progress Notes (Signed)
Primary Care Physician:  System, Pcp Not In  Primary GI: Dr. Jena Gauss   Patient Location: Home   Provider Location: [facility] office   Reason for Visit: Follow-up    Persons present on the virtual encounter, with roles: Patient and NP     Due to COVID-19, visit was conducted using virtual method.  Visit was requested by patient.  Virtual Visit via MyChart Video Note Due to COVID-19, visit is conducted virtually and was requested by patient.   I connected with Tiffany Jackson on 01/18/20 at  8:00 AM EDT by telephone and verified that I am speaking with the correct person using two identifiers.   I discussed the limitations, risks, security and privacy concerns of performing an evaluation and management service by telephone and the availability of in person appointments. I also discussed with the patient that there may be a patient responsible charge related to this service. The patient expressed understanding and agreed to proceed.  Chief Complaint  Patient presents with  . Gastroesophageal Reflux    doing ok     History of Present Illness: Pleasant 39 year old female with history of GERD and symptomatic hemorrhoids, undergoing banding of all 3 columns and in neutral position. Here for follow-up.   No itching or burning. No prolapsing tissue. Has a skin tag. No rectal pain. No abdominal pain. No N/V. colonoscopyNov 2019: non-bleeding internal hemorrhoids, diverticulosis in entire examined colon.She has had chronic diarrhea s/p cholecystectomy but doing well with Colestid. Symptomatic hemorrhoids resolved.   Taking omeprazole for GERD, when she remembers to take it. Colestid 1 gram tablet daily. 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 Able to go out in public now and eat. Stool well-formed now. Feels BMs are back to normal. Once in a blue moon will eat something fatty and have an issue. No refills needed.   Past Medical History:  Diagnosis Date  . Allergy    Seasonal  . Anxiety   . Chicken pox    . Depression   . Fibromyalgia   . Frequent headaches   . GERD (gastroesophageal reflux disease)   . Hypertension   . Migraines      Past Surgical History:  Procedure Laterality Date  . CESAREAN SECTION     2003, 2005, 2009  . CHOLECYSTECTOMY N/A 04/04/2015   Procedure: LAPAROSCOPIC CHOLECYSTECTOMY;  Surgeon: Natale Lay, MD;  Location: [facility] ORS;  Service: General;  Laterality: N/A;  . COLONOSCOPY WITH PROPOFOL N/A 07/15/2018   non-bleeding internal hemorrhoids, diverticulosis in entire examined colon.      Current Meds  Medication Sig  . albuterol (PROVENTIL HFA;VENTOLIN HFA) 108 (90 BASE) MCG/ACT inhaler Inhale 2 puffs into the lungs as needed for wheezing or shortness of breath.  . ALPRAZolam (XANAX) 0.25 MG tablet TAKE 1 TABLET BY MOUTH TWICE DAILY AS NEEDED FOR ANXIETY (Patient taking differently: Take 0.25 mg by mouth 2 (two) times daily as needed for anxiety. )  . baclofen (LIORESAL) 5 mg TABS tablet Take 5 mg by mouth 2 (two) times daily.  . colestipol (COLESTID) 1 g tablet Take 1 tablet (1 g total) by mouth daily.  . cyclobenzaprine (FLEXERIL) 10 MG tablet as needed.   . DULoxetine (CYMBALTA) 30 MG capsule Take 30 mg by mouth daily.  . Multiple Vitamin (MULTIVITAMIN) tablet Take 1 tablet by mouth daily.  . naproxen (NAPROSYN) 500 MG tablet as needed.   Marland Kitchen omeprazole (PRILOSEC) 20 MG capsule Take 20 mg by mouth daily. When she remembers to take it  .  Probiotic Product (PROBIOTIC DAILY PO) Take 1 tablet by mouth daily.   Marland Kitchen spironolactone (ALDACTONE) 25 MG tablet Take 25 mg by mouth 2 (two) times daily.   Marland Kitchen venlafaxine XR (EFFEXOR-XR) 75 MG 24 hr capsule TAKE 1 CAPSULE DAILY WITH BREAKFAST (Patient taking differently: Take 75 mg by mouth at bedtime. )  . Vitamin D, Cholecalciferol, 1000 UNITS TABS Take 1,000 Units by mouth daily.      Family History  Problem Relation Age of Onset  . Multiple sclerosis Mother   . Hypertension Father   . Cancer Maternal Grandmother 50        Breast Cancer  . Cancer Paternal Grandfather        Lung Cancer  . Anuerysm Paternal Grandmother   . Heart disease Daughter   . Colon cancer Neg Hx   . Gastric cancer Neg Hx   . Esophageal cancer Neg Hx   . Crohn's disease Neg Hx   . Ulcerative colitis Neg Hx     Social History   Socioeconomic History  . Marital status: Married    Spouse name: Not on file  . Number of children: Not on file  . Years of education: Not on file  . Highest education level: Not on file  Occupational History  . Not on file  Tobacco Use  . Smoking status: Current Every Day Smoker    Packs/day: 0.50    Years: 12.00    Pack years: 6.00    Types: Cigarettes  . Smokeless tobacco: Never Used  Substance and Sexual Activity  . Alcohol use: No    Alcohol/week: 0.0 standard drinks  . Drug use: No  . Sexual activity: Yes    Partners: Male    Birth control/protection: Surgical    Comment: Husband  Other Topics Concern  . Not on file  [place] her 3 children    Pets: Dogs, cats, turtle, and snake    Outside pets: chickens, ducks    Caffeine- Cut out caffeine in diet    Social Determinants of Health   Financial Resource Strain:   . Difficulty of Paying Living Expenses:   Food Insecurity:   . Worried About Charity fundraiser in the Last Year:   . Arboriculturist in the Last Year:   Transportation Needs:   . Film/video editor (Medical):   Marland Kitchen Lack of Transportation (Non-Medical):   Physical Activity:   . Days of Exercise per Week:   . Minutes of Exercise per Session:   Stress:   . Feeling of Stress :   Social Connections:   . Frequency of Communication with Friends and Family:   . Frequency of Social Gatherings with Friends and Family:   . Attends Religious Services:   . Active Member of Clubs or Organizations:   . Attends Archivist Meetings:   Marland Kitchen Marital Status:        Review of Systems: Gen: Denies fever, chills, anorexia. Denies fatigue,  weakness, weight loss.  CV: Denies chest pain, palpitations, syncope, peripheral edema, and claudication. Resp: Denies dyspnea at rest, cough, wheezing, coughing up blood, and pleurisy. GI: see HPI Derm: Denies rash, itching, dry skin Psych: Denies depression, anxiety, memory loss, confusion. No homicidal or suicidal ideation.  Heme: Denies bruising, bleeding, and enlarged lymph nodes.  Observations/Objective: No distress. Patient cooperative, in good spirits.   Assessment and Plan: 39 year old female with history of chronic GERD, doing well on Prilosec daily, history  of symptomatic hemorrhoids s/p banding with resolution, and chronic diarrhea post-cholecystectomy that is doing well with Colestid daily.  She is doing great after banding of all columns and a neutral banding. No refills needed today. As she is doing well, we will see her back in 1 year.   Follow Up Instructions: See AVS   I discussed the assessment and treatment plan with the patient. The patient was provided an opportunity to ask questions and all were answered. The patient agreed with the plan and demonstrated an understanding of the instructions.   The patient was advised to call back or seek an in-person evaluation if the symptoms worsen or if the condition fails to improve as anticipated.   Gelene Mink, PhD, ANP-BC [facility]

## 2020-01-19 DIAGNOSIS — F4312 Post-traumatic stress disorder, chronic: Secondary | ICD-10-CM | POA: Diagnosis not present

## 2020-01-20 DIAGNOSIS — Z13220 Encounter for screening for lipoid disorders: Secondary | ICD-10-CM | POA: Diagnosis not present

## 2020-01-20 DIAGNOSIS — Z0000 Encounter for general adult medical examination without abnormal findings: Secondary | ICD-10-CM | POA: Diagnosis not present

## 2020-01-20 DIAGNOSIS — Z131 Encounter for screening for diabetes mellitus: Secondary | ICD-10-CM | POA: Diagnosis not present

## 2020-01-20 DIAGNOSIS — Z79899 Other long term (current) drug therapy: Secondary | ICD-10-CM | POA: Diagnosis not present

## 2020-01-25 ENCOUNTER — Telehealth: Payer: Self-pay | Admitting: *Deleted

## 2020-01-25 NOTE — Telephone Encounter (Signed)
Pt consented to a virtual/telephone visit on 01/18/20. 

## 2020-01-25 NOTE — Telephone Encounter (Signed)
Tiffany Jackson, you are scheduled for a virtual visit with your provider today.  Just as we do with appointments in the office, we must obtain your consent to participate.  Your consent will be active for this visit and any virtual visit you may have with one of our providers in the next 365 days.  If you have a MyChart account, I can also send a copy of this consent to you electronically.  All virtual visits are billed to your insurance company just like a traditional visit in the office.  As this is a virtual visit, video technology does not allow for your provider to perform a traditional examination.  This may limit your provider's ability to fully assess your condition.  If your provider identifies any concerns that need to be evaluated in person or the need to arrange testing such as labs, EKG, etc, we will make arrangements to do so.  Although advances in technology are sophisticated, we cannot ensure that it will always work on either your end or our end.  If the connection with a video visit is poor, we may have to switch to a telephone visit.  With either a video or telephone visit, we are not always able to ensure that we have a secure connection.   I need to obtain your verbal consent now.   Are you willing to proceed with your visit today?

## 2020-02-21 DIAGNOSIS — Z9049 Acquired absence of other specified parts of digestive tract: Secondary | ICD-10-CM | POA: Diagnosis not present

## 2020-02-21 DIAGNOSIS — Z0001 Encounter for general adult medical examination with abnormal findings: Secondary | ICD-10-CM | POA: Diagnosis not present

## 2020-02-21 DIAGNOSIS — M797 Fibromyalgia: Secondary | ICD-10-CM | POA: Diagnosis not present

## 2020-02-21 DIAGNOSIS — F17210 Nicotine dependence, cigarettes, uncomplicated: Secondary | ICD-10-CM | POA: Diagnosis not present

## 2020-02-23 DIAGNOSIS — F4312 Post-traumatic stress disorder, chronic: Secondary | ICD-10-CM | POA: Diagnosis not present

## 2020-03-14 DIAGNOSIS — F4312 Post-traumatic stress disorder, chronic: Secondary | ICD-10-CM | POA: Diagnosis not present

## 2020-04-05 DIAGNOSIS — M222X1 Patellofemoral disorders, right knee: Secondary | ICD-10-CM | POA: Diagnosis not present

## 2020-04-17 DIAGNOSIS — M222X1 Patellofemoral disorders, right knee: Secondary | ICD-10-CM | POA: Diagnosis not present

## 2020-04-19 DIAGNOSIS — M25561 Pain in right knee: Secondary | ICD-10-CM | POA: Diagnosis not present

## 2020-04-19 DIAGNOSIS — M222X1 Patellofemoral disorders, right knee: Secondary | ICD-10-CM | POA: Diagnosis not present

## 2020-04-19 DIAGNOSIS — M6281 Muscle weakness (generalized): Secondary | ICD-10-CM | POA: Diagnosis not present

## 2020-05-01 DIAGNOSIS — R002 Palpitations: Secondary | ICD-10-CM | POA: Diagnosis not present

## 2020-05-01 DIAGNOSIS — I471 Supraventricular tachycardia: Secondary | ICD-10-CM | POA: Diagnosis not present

## 2020-05-01 DIAGNOSIS — F411 Generalized anxiety disorder: Secondary | ICD-10-CM | POA: Diagnosis not present

## 2020-05-01 DIAGNOSIS — F320 Major depressive disorder, single episode, mild: Secondary | ICD-10-CM | POA: Diagnosis not present

## 2020-05-03 DIAGNOSIS — M6281 Muscle weakness (generalized): Secondary | ICD-10-CM | POA: Diagnosis not present

## 2020-05-03 DIAGNOSIS — M25561 Pain in right knee: Secondary | ICD-10-CM | POA: Diagnosis not present

## 2020-05-03 DIAGNOSIS — M222X1 Patellofemoral disorders, right knee: Secondary | ICD-10-CM | POA: Diagnosis not present

## 2020-05-10 DIAGNOSIS — M25561 Pain in right knee: Secondary | ICD-10-CM | POA: Diagnosis not present

## 2020-05-10 DIAGNOSIS — M6281 Muscle weakness (generalized): Secondary | ICD-10-CM | POA: Diagnosis not present

## 2020-05-10 DIAGNOSIS — M222X1 Patellofemoral disorders, right knee: Secondary | ICD-10-CM | POA: Diagnosis not present

## 2020-05-17 DIAGNOSIS — M25561 Pain in right knee: Secondary | ICD-10-CM | POA: Diagnosis not present

## 2020-05-17 DIAGNOSIS — M222X1 Patellofemoral disorders, right knee: Secondary | ICD-10-CM | POA: Diagnosis not present

## 2020-05-17 DIAGNOSIS — M6281 Muscle weakness (generalized): Secondary | ICD-10-CM | POA: Diagnosis not present

## 2020-05-24 DIAGNOSIS — R000 Tachycardia, unspecified: Secondary | ICD-10-CM | POA: Diagnosis not present

## 2020-06-01 DIAGNOSIS — R000 Tachycardia, unspecified: Secondary | ICD-10-CM | POA: Diagnosis not present

## 2020-06-11 ENCOUNTER — Other Ambulatory Visit: Payer: Self-pay | Admitting: Gastroenterology

## 2020-07-31 DIAGNOSIS — R002 Palpitations: Secondary | ICD-10-CM | POA: Diagnosis not present

## 2020-07-31 DIAGNOSIS — F411 Generalized anxiety disorder: Secondary | ICD-10-CM | POA: Diagnosis not present

## 2020-07-31 DIAGNOSIS — I10 Essential (primary) hypertension: Secondary | ICD-10-CM | POA: Diagnosis not present

## 2020-07-31 DIAGNOSIS — R000 Tachycardia, unspecified: Secondary | ICD-10-CM | POA: Diagnosis not present

## 2020-10-01 DIAGNOSIS — R000 Tachycardia, unspecified: Secondary | ICD-10-CM | POA: Diagnosis not present

## 2020-10-01 DIAGNOSIS — F411 Generalized anxiety disorder: Secondary | ICD-10-CM | POA: Diagnosis not present

## 2020-10-01 DIAGNOSIS — R002 Palpitations: Secondary | ICD-10-CM | POA: Diagnosis not present

## 2020-10-01 DIAGNOSIS — I10 Essential (primary) hypertension: Secondary | ICD-10-CM | POA: Diagnosis not present

## 2020-12-03 ENCOUNTER — Encounter: Payer: Self-pay | Admitting: Internal Medicine

## 2021-04-01 ENCOUNTER — Other Ambulatory Visit (HOSPITAL_COMMUNITY)
Admission: RE | Admit: 2021-04-01 | Discharge: 2021-04-01 | Disposition: A | Discharge status: Home / Self Care | Payer: BC Managed Care – PPO | Source: Ambulatory Visit | Attending: Obstetrics & Gynecology | Admitting: Obstetrics & Gynecology

## 2021-04-01 ENCOUNTER — Other Ambulatory Visit: Payer: Self-pay

## 2021-04-01 ENCOUNTER — Ambulatory Visit (INDEPENDENT_AMBULATORY_CARE_PROVIDER_SITE_OTHER): Payer: BC Managed Care – PPO | Admitting: Women's Health

## 2021-04-01 ENCOUNTER — Encounter: Payer: Self-pay | Admitting: Women's Health

## 2021-04-01 VITALS — BP 132/85 | HR 76 | Ht 63.0 in | Wt 236.0 lb

## 2021-04-01 DIAGNOSIS — Z124 Encounter for screening for malignant neoplasm of cervix: Secondary | ICD-10-CM

## 2021-04-01 DIAGNOSIS — N951 Menopausal and female climacteric states: Secondary | ICD-10-CM

## 2021-04-01 DIAGNOSIS — Z01419 Encounter for gynecological examination (general) (routine) without abnormal findings: Secondary | ICD-10-CM

## 2021-04-01 NOTE — Progress Notes (Signed)
WELL-WOMAN EXAMINATION Patient name: Tiffany Jackson MRN [id number]  Date of birth: 09/16/1980 Chief Complaint:   Annual Exam (Irregular cycles)  History of Present Illness:   Homer Pfeifer is a 40 y.o. G3P3 Caucasian female being seen today for a routine well-woman exam. S/P BTL. Thinks she's going through menopause. Hot flashes x 2-28yrs, getting much worse, night sweats, vaginal dryness. Has always had irregular periods, but worse lately. Can go w/o period, usually only bleeds x 3d, last period bled x 3wks. Mom was 35yo when she went through menopause. No older sisters. Former smoker.   PCP: Werner Lean in [place]      does not desire routine labs, done w/ PCP Patient's last menstrual period was 02/11/2021. The current method of family planning is tubal ligation.  Last pap 2018. Results were: NILM w/ HRHPV negative. H/O abnormal pap: no Last mammogram: never. Results were: N/A. Family h/o breast cancer: yes MGM in 55s Last colonoscopy: 2019 d/t IBS. Results were: normal. Family h/o colorectal cancer: no  Depression screen [facility] 2/9 04/01/2021 10/09/2016 09/14/2015  Decreased Interest 1 0 0  Down, Depressed, Hopeless 0 0 0  PHQ - 2 Score 1 0 0  Altered sleeping 3 - -  Tired, decreased energy 2 - -  Change in appetite 2 - -  Feeling bad or failure about yourself  1 - -  Trouble concentrating 0 - -  Moving slowly or fidgety/restless 1 - -  Suicidal thoughts 0 - -  PHQ-9 Score 10 - -     GAD 7 : Generalized Anxiety Score 04/01/2021  Nervous, Anxious, on Edge 1  Control/stop worrying 1  Worry too much - different things 1  Trouble relaxing 1  Restless 1  Easily annoyed or irritable 1  Afraid - awful might happen 0  Total GAD 7 Score 6     Review of Systems:   Pertinent items are noted in HPI Denies any headaches, blurred vision, fatigue, shortness of breath, chest pain, abdominal pain, abnormal vaginal discharge/itching/odor/irritation, problems with periods, bowel movements,  urination, or intercourse unless otherwise stated above. Pertinent History Reviewed:  Reviewed past medical,surgical, social and family history.  Reviewed problem list, medications and allergies. Physical Assessment:   Vitals:   04/01/21 1434  BP: 132/85  Pulse: 76  Weight: 236 lb (107 kg)  Height: 5\' 3"  (1.6 m)  Body mass index is 41.81 kg/m.        Physical Examination:   General appearance - well appearing, and in no distress  Mental status - alert, oriented to person, place, and time  Psych:  She has a normal mood and affect  Skin - warm and dry, normal color, no suspicious lesions noted  Chest - effort normal, all lung fields clear to auscultation bilaterally  Heart - normal rate and regular rhythm  Neck:  midline trachea, no thyromegaly or nodules  Breasts - breasts appear normal, no suspicious masses, no skin or nipple changes or  axillary nodes  Abdomen - soft, nontender, nondistended, no masses or organomegaly  Pelvic - VULVA: normal appearing vulva with no masses, tenderness or lesions  VAGINA: normal appearing vagina with normal color and discharge, no lesions  CERVIX: normal appearing cervix without discharge or lesions, no CMT  Thin prep pap is done w/ HR HPV cotesting  UTERUS: uterus is felt to be normal size, shape, consistency and nontender   ADNEXA: No adnexal masses or tenderness noted.  Extremities:  No swelling or varicosities noted  Chaperone:  Latisha Cresenzo    No results found for this or any previous visit (from the past 24 hour(s)).  Assessment & Plan:  1) Well-Woman Exam  2) Perimenopausal symptoms> mom menopausal at 35yo, will check FSH and contact pt w/ results  Labs/procedures today: pap, FSH  Mammogram: @ 40yo, or sooner if problems Colonoscopy: @ 39yo, or sooner if problems  Orders Placed This Encounter  Procedures   FSH    Meds: No orders of the defined types were placed in this encounter.   Follow-up: Return for will contact  pt.  Cheral Marker CNM, [facility] 04/01/2021 3:10 PM

## 2021-04-02 ENCOUNTER — Other Ambulatory Visit: Payer: Self-pay | Admitting: Women's Health

## 2021-04-02 DIAGNOSIS — N926 Irregular menstruation, unspecified: Secondary | ICD-10-CM

## 2021-04-02 LAB — FOLLICLE STIMULATING HORMONE: FSH: 6.4 m[IU]/mL

## 2021-04-07 LAB — CYTOLOGY - PAP
Adequacy: ABSENT
Comment: NEGATIVE
Diagnosis: NEGATIVE
High risk HPV: NEGATIVE

## 2021-04-10 ENCOUNTER — Other Ambulatory Visit: Payer: Self-pay

## 2021-04-10 ENCOUNTER — Ambulatory Visit (INDEPENDENT_AMBULATORY_CARE_PROVIDER_SITE_OTHER): Payer: BC Managed Care – PPO

## 2021-04-10 DIAGNOSIS — N926 Irregular menstruation, unspecified: Secondary | ICD-10-CM

## 2021-04-10 NOTE — Progress Notes (Signed)
PELVIC US TA/TV: homogeneous anteverted uterus,wnl,EEC 8.4 mm,normal ovaries,ovaries appear mobile,no free fluid  Chaperone Marchelle Folks

## 2021-06-04 ENCOUNTER — Other Ambulatory Visit (INDEPENDENT_AMBULATORY_CARE_PROVIDER_SITE_OTHER): Payer: BC Managed Care – PPO

## 2021-06-04 ENCOUNTER — Other Ambulatory Visit: Payer: Self-pay

## 2021-06-04 DIAGNOSIS — R399 Unspecified symptoms and signs involving the genitourinary system: Secondary | ICD-10-CM

## 2021-06-04 LAB — POCT URINALYSIS DIPSTICK OB
Glucose, UA: NEGATIVE
Ketones, UA: NEGATIVE
Nitrite, UA: NEGATIVE
POC,PROTEIN,UA: NEGATIVE

## 2021-06-04 MED ORDER — NITROFURANTOIN MONOHYD MACRO 100 MG PO CAPS: 100.0000 mg | ORAL_CAPSULE | Freq: Two times a day (BID) | ORAL | 0 refills | Status: DC

## 2021-06-04 NOTE — Addendum Note (Signed)
Addended by: Cheral Marker on: 06/04/2021 10:42 AM   Modules accepted: Orders

## 2021-06-04 NOTE — Progress Notes (Addendum)
   NURSE VISIT- UTI SYMPTOMS   SUBJECTIVE:  Tiffany Jackson is a 40 y.o. No obstetric history on file. female here for UTI symptoms. She is a GYN patient. She reports urinary frequency, urinary retention, urinary urgency, and burning at end of urination .  OBJECTIVE:  There were no vitals taken for this visit.  Appears well, in no apparent distress  Results for orders placed or performed in visit on 06/04/21 (from the past 24 hour(s))  POC Urinalysis Dipstick OB   Collection Time: 06/04/21 10:21 AM  Result Value Ref Range   Color, UA     Clarity, UA     Glucose, UA Negative Negative   Bilirubin, UA     Ketones, UA neg    Spec Grav, UA     Blood, UA mod    pH, UA     POC,PROTEIN,UA Negative Negative, Trace, Small (1+), Moderate (2+), Large (3+), 4+   Urobilinogen, UA     Nitrite, UA neg    Leukocytes, UA Large (3+) (A) Negative   Appearance     Odor      ASSESSMENT: GYN patient with UTI symptoms and negative nitrites  PLAN: Note routed to Joellyn Haff, CNM, [facility]   Rx sent by provider today: No Urine culture sent Call or return to clinic prn if these symptoms worsen or fail to improve as anticipated. Follow-up: as needed   Vincenza Dail A Tziporah Knoke  06/04/2021 10:22 AM   Chart reviewed for nurse visit. Agree with plan of care. D/t sx and 3+leuks/mod blood, rx macrobid and send cx.  Cheral Marker, [place] 06/04/2021 10:41 AM

## 2021-06-08 ENCOUNTER — Other Ambulatory Visit: Payer: Self-pay | Admitting: Gastroenterology

## 2021-06-10 ENCOUNTER — Encounter: Payer: Self-pay | Admitting: Internal Medicine

## 2021-06-10 NOTE — Telephone Encounter (Signed)
Needs OV for additional refills in the next 3-4 months.

## 2021-06-13 LAB — URINE CULTURE

## 2021-09-13 ENCOUNTER — Ambulatory Visit: Payer: BC Managed Care – PPO | Admitting: Internal Medicine

## 2021-09-13 ENCOUNTER — Other Ambulatory Visit: Payer: Self-pay

## 2021-09-13 ENCOUNTER — Encounter: Payer: Self-pay | Admitting: Internal Medicine

## 2021-09-13 VITALS — BP 141/93 | HR 96 | Temp 96.9°F | Ht 63.0 in | Wt 242.8 lb

## 2021-09-13 DIAGNOSIS — K625 Hemorrhage of anus and rectum: Secondary | ICD-10-CM

## 2021-09-13 DIAGNOSIS — K529 Noninfective gastroenteritis and colitis, unspecified: Secondary | ICD-10-CM | POA: Diagnosis not present

## 2021-09-13 DIAGNOSIS — K642 Third degree hemorrhoids: Secondary | ICD-10-CM | POA: Diagnosis not present

## 2021-09-13 DIAGNOSIS — K219 Gastro-esophageal reflux disease without esophagitis: Secondary | ICD-10-CM

## 2021-09-13 NOTE — Patient Instructions (Addendum)
It was good to see you again today!  Hemorrhoid information provided  Hemorrhoid banding pamphlet provided  Continue Colestid daily  We will schedule hemorrhoid banding in the near future once a appointment time has been identified on the schedule  Please, wear your CPAP!  Further recommendations to follow.

## 2021-09-13 NOTE — Progress Notes (Signed)
i   Primary Care Physician:  Pcp, [facility] Primary Gastroenterologist:  Dr.   Pre-Procedure History & Physical: HPI:  Tiffany Jackson is a 41 y.o. female here for recurrent rectal bleeding.  Hemorrhoid banding all columns and neutral position previously.  This was associated with resolution of bleeding last session about a year and a half ago last couple of months she is noted small amounts of blood per rectum has been BMs 1-2 to 3 times daily on Colestid no abdominal pain no pain with bleeding.  Known internal hemorrhoids last colonoscopy 2019; will be due for average risk screening age 40.  Has not had any abdominal pain; has sleep apnea does not wear CPAP.   Past Medical History:  Diagnosis Date   Allergy    Seasonal   Anxiety    Chicken pox    Depression    Fibromyalgia    Frequent headaches    GERD (gastroesophageal reflux disease)    Hypertension    Migraines     Past Surgical History:  Procedure Laterality Date   CESAREAN SECTION     2003, 2005, 2009   CHOLECYSTECTOMY N/A 04/04/2015   Procedure: LAPAROSCOPIC CHOLECYSTECTOMY;  Surgeon: Sherri Rad, MD;  Location: [facility] ORS;  Service: General;  Laterality: N/A;   COLONOSCOPY WITH PROPOFOL N/A 07/15/2018   non-bleeding internal hemorrhoids, diverticulosis in entire examined colon.     Prior to Admission medications   Medication Sig Start Date End Date Taking? Authorizing Provider  albuterol (PROVENTIL HFA;VENTOLIN HFA) 108 (90 BASE) MCG/ACT inhaler Inhale 2 puffs into the lungs as needed for wheezing or shortness of breath. 05/01/15  Yes Doss, Velora Heckler, RN  baclofen (LIORESAL) 5 mg TABS tablet Take 5 mg by mouth 2 (two) times daily.   Yes [provider]  colestipol (COLESTID) 1 g tablet TAKE 1 TABLET(1 GRAM) BY MOUTH DAILY 06/10/21  Yes Erenest Rasher, PA-C  DULoxetine (CYMBALTA) 60 MG capsule Take 60 mg by mouth daily. 08/10/21  Yes [provider]  melatonin 5 MG TABS Take 5 mg by mouth at bedtime.    Yes [provider]  Multiple Vitamin (MULTIVITAMIN) tablet Take 1 tablet by mouth daily.   Yes [provider]  naproxen (NAPROSYN) 500 MG tablet as needed. 08/09/18  Yes [provider]  omega-3 fish oil (MAXEPA) 1000 MG CAPS capsule Take 1 capsule by mouth daily.   Yes [provider]  propranolol ER (INDERAL LA) 60 MG 24 hr capsule Take 60 mg by mouth daily. 03/03/21  Yes [provider]  spironolactone (ALDACTONE) 25 MG tablet Take 25 mg by mouth 2 (two) times daily.  09/23/18  Yes [provider]  venlafaxine XR (EFFEXOR-XR) 75 MG 24 hr capsule TAKE 1 CAPSULE DAILY WITH BREAKFAST Patient taking differently: Take 75 mg by mouth at bedtime. 09/22/17  Yes Burnard Hawthorne, FNP  Vitamin D, Cholecalciferol, 1000 UNITS TABS Take 1,000 Units by mouth daily.    Yes [provider]  DULoxetine (CYMBALTA) 30 MG capsule Take 30 mg by mouth daily. Patient not taking: Reported on 09/13/2021    [provider]  nitrofurantoin, macrocrystal-monohydrate, (MACROBID) 100 MG capsule Take 1 capsule (100 mg total) by mouth 2 (two) times daily. X 7 days Patient not taking: Reported on 09/13/2021 06/04/21   Roma Schanz, CNM    Allergies as of 09/13/2021 - Review Complete 09/13/2021  Allergen Reaction Noted   Augmentin [amoxicillin-pot clavulanate] Other (See Comments) 12/30/2016    Family History  Problem Relation Age of Onset   Multiple sclerosis Mother    Hypertension Father    Cancer Maternal Grandmother 51       Breast Cancer   Cancer Paternal Grandfather        Lung Cancer   Anuerysm Paternal Grandmother    Heart disease Daughter    Colon cancer Neg Hx    Gastric cancer Neg Hx    Esophageal cancer Neg Hx    Crohn's disease Neg Hx    Ulcerative colitis Neg Hx     Social History   Socioeconomic History   Marital status: Married    Spouse name: Not on file   Number of children: Not on file   Years of education: Not  on file   Highest education level: Not on file  Occupational History   Not on file  Tobacco Use   Smoking status: Former    Packs/day: 0.50    Years: 12.00    Pack years: 6.00    Types: Cigarettes   Smokeless tobacco: Never  Vaping Use   Vaping Use: Some days  Substance and Sexual Activity   Alcohol use: No    Alcohol/week: 0.0 standard drinks   Drug use: No   Sexual activity: Yes    Partners: Male    Birth control/protection: Surgical    Comment: Husband  Other Topics Concern   Not on file  Social History Narrative   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    Pets: Dogs, cats, turtle, and snake    Outside pets: chickens, ducks    Caffeine- Cut out caffeine in diet    Social Determinants of Health   Financial Resource Strain: Low Risk    Difficulty of Paying Living Expenses: Not hard at all  Food Insecurity: No Food Insecurity   Worried About Charity fundraiser in the Last Year: Never true   Arboriculturist in the Last Year: Never true  Transportation Needs: No Transportation Needs   Lack of Transportation (Medical): No   Lack of Transportation (Non-Medical): No  Physical Activity: Inactive   Days of Exercise per Week: 0 days   Minutes of Exercise per Session: 0 min  Stress: Stress Concern Present   Feeling of Stress : Very much  Social Connections: Socially Integrated   Frequency of Communication with Friends and Family: Three times a week   Frequency of Social Gatherings with Friends and Family: Twice a week   Attends Religious Services: More than 4 times per year   Active Member of [employer] or Organizations: Yes   Attends Music therapist: More than 4 times per year   Marital Status: Married  Human resources officer Violence: Not At Risk   Fear of Current or Ex-Partner: No   Emotionally Abused: No   Physically Abused: No   Sexually Abused: No    Review of Systems: See HPI, otherwise negative ROS  Physical Exam: BP (!) 141/93    Pulse 96    Temp (!) 96.9 F  (36.1 C) (Temporal)    Ht 5\' 3"  (1.6 m)    Wt 242 lb 12.8 oz (110.1 kg)    LMP 09/03/2021 (Approximate)    BMI 43.01 kg/m  General:   Alert,  Well-developed, well-nourished, pleasant and cooperative in NAD Neck:  Supple; no masses or thyromegaly. No significant cervical adenopathy. Lungs:  Clear throughout to auscultation.   No wheezes, crackles, or rhonchi. No acute distress. Heart:  Regular rate and rhythm; no murmurs,  clicks, rubs,  or gallops. Abdomen: Non-distended, normal bowel sounds.  Soft and nontender without appreciable mass or hepatosplenomegaly.  Rectal: Single hemorrhoid tag protruding..  Easily reducible.  Rectal canal nontender.  No masses.  No stool.  Impression/Plan: Pleasant 41 year old lady with paper hematochezia.  She is up-to-date on colonoscopy.  Known hemorrhoids.  Had a fairly good response to banding previously.  She does have a single grade 3 hemorrhoid tag.  She likely needs touchup banding.  Still, no reason to repeat colonoscopy at this time.  Recommendations  Hemorrhoid information provided  Hemorrhoid banding pamphlet provided  Continue Colestid daily  We will schedule hemorrhoid banding in the near future once a appointment time has been identified on the schedule  [facility]!  Further recommendations to follow.     Notice: This dictation was prepared with Dragon dictation along with smaller phrase technology. Any transcriptional errors that result from this process are unintentional and may not be corrected upon review.

## 2021-09-20 ENCOUNTER — Ambulatory Visit: Payer: BC Managed Care – PPO | Admitting: Internal Medicine

## 2021-09-20 ENCOUNTER — Other Ambulatory Visit: Payer: Self-pay

## 2021-11-01 ENCOUNTER — Telehealth: Payer: BC Managed Care – PPO | Admitting: Physician Assistant

## 2021-11-01 DIAGNOSIS — J019 Acute sinusitis, unspecified: Secondary | ICD-10-CM | POA: Diagnosis not present

## 2021-11-01 DIAGNOSIS — B9689 Other specified bacterial agents as the cause of diseases classified elsewhere: Secondary | ICD-10-CM

## 2021-11-01 MED ORDER — DOXYCYCLINE HYCLATE 100 MG PO TABS: 100.0000 mg | ORAL_TABLET | Freq: Two times a day (BID) | ORAL | 0 refills | Status: DC

## 2021-11-01 NOTE — Progress Notes (Signed)
I have spent 5 minutes in review of e-visit questionnaire, review and updating patient chart, medical decision making and response to patient.   Beverley Sherrard Cody Sadaf Przybysz, PA-C    

## 2021-11-01 NOTE — Progress Notes (Signed)

## 2021-12-10 ENCOUNTER — Other Ambulatory Visit: Payer: Self-pay | Admitting: Gastroenterology

## 2021-12-20 DIAGNOSIS — M797 Fibromyalgia: Secondary | ICD-10-CM | POA: Diagnosis not present

## 2021-12-20 DIAGNOSIS — Z79899 Other long term (current) drug therapy: Secondary | ICD-10-CM | POA: Diagnosis not present

## 2022-01-15 ENCOUNTER — Encounter: Payer: Self-pay | Admitting: Gastroenterology

## 2022-01-15 ENCOUNTER — Ambulatory Visit: Payer: BC Managed Care – PPO | Admitting: Gastroenterology

## 2022-01-15 VITALS — BP 121/87 | HR 80 | Temp 97.1°F | Ht 63.0 in | Wt 242.0 lb

## 2022-01-15 DIAGNOSIS — K642 Third degree hemorrhoids: Secondary | ICD-10-CM

## 2022-01-15 NOTE — Patient Instructions (Signed)
Please continue to avoid straining. ? ?You should limit your toilet time to 2-3 minutes at the most.  ? ?Continue to avoid constipation. ? ?Please call me with any concerns or issues! ? ?I will see you in follow-up for additional banding in several weeks. ? ?Congratulations on your upcoming graduation with your son!! ? ?I enjoyed seeing you again today! As you know, I value our relationship and want to provide genuine, compassionate, and quality care. I welcome your feedback. If you receive a survey regarding your visit,  I greatly appreciate you taking time to fill this out. See you next time! ? ?Gelene Mink, PhD, ANP-BC ?[facility]  ? ? ? ? ? ? ? ? ? ?

## 2022-01-15 NOTE — Progress Notes (Signed)
? ? ?  [facility] BANDING PROCEDURE NOTE ? ?Tiffany Jackson is a 41 y.o. female presenting today for consideration of hemorrhoid banding. Last colonoscopy 2019. She had prior bandings in 2020. Presenting today with recurrent symptoms.  ? ? ?The patient presents with symptomatic grade 3 hemorrhoids, unresponsive to maximal medical therapy, requesting rubber band ligation of his/her hemorrhoidal disease. All risks, benefits, and alternative forms of therapy were described and informed consent was obtained. ? ?In the left lateral decubitus position, anoscopic examination revealed grade 3 hemorrhoids in the left lateral and right anterior position (s). ? ?The decision was made to band the left lateral internal hemorrhoid, and the [facility] O'Regan System was used to perform band ligation without complication. Digital anorectal examination was then performed to assure proper positioning of the band, and to adjust the banded tissue as required. The patient was discharged home without pain or other issues. Dietary and behavioral recommendations were given, along with follow-up instructions. The patient will return in several weeks for followup and possible additional banding as required. ? ?No complications were encountered and the patient tolerated the procedure well.  ? ?Gelene Mink, PhD, ANP-BC ?[facility]  ? ?

## 2022-01-30 ENCOUNTER — Encounter: Payer: Self-pay | Admitting: Gastroenterology

## 2022-01-30 ENCOUNTER — Ambulatory Visit: Payer: BC Managed Care – PPO | Admitting: Gastroenterology

## 2022-01-30 VITALS — BP 124/70 | HR 119 | Temp 98.0°F | Ht 63.0 in | Wt 241.0 lb

## 2022-01-30 DIAGNOSIS — K642 Third degree hemorrhoids: Secondary | ICD-10-CM

## 2022-01-30 NOTE — Progress Notes (Signed)
    [facility] BANDING PROCEDURE NOTE  Tiffany Jackson is a 41 y.o. female presenting today for consideration of hemorrhoid banding. Last colonoscopy 2019. Previous bandings in 2020. Last seen a few weeks ago with left lateral banding. Anoscopy at that time with prominence of left lateral and right anterior. She has noted some improvement.    The patient presents with symptomatic grade 3 hemorrhoids, unresponsive to maximal medical therapy, requesting rubber band ligation of her hemorrhoidal disease. All risks, benefits, and alternative forms of therapy were described and informed consent was obtained.  The decision was made to band the right anterior internal hemorrhoid, and the [facility] O'Regan System was used to perform band ligation without complication. Digital anorectal examination was then performed to assure proper positioning of the band, and to adjust the banded tissue as required. The patient was discharged home without pain or other issues. Dietary and behavioral recommendations were given, along with follow-up instructions. The patient will return in several weeks for followup and possible additional banding as required.  No complications were encountered and the patient tolerated the procedure well.   Gelene Mink, PhD, ANP-BC [facility]

## 2022-01-30 NOTE — Patient Instructions (Signed)
Please continue to avoid straining.  You should limit your toilet time to 2-3 minutes at the most.   Continue to avoid constipation.  Please call me with any concerns or issues!  I will see you in follow-up for additional banding in several weeks.    I enjoyed seeing you again today! As you know, I value our relationship and want to provide genuine, compassionate, and quality care. I welcome your feedback. If you receive a survey regarding your visit,  I greatly appreciate you taking time to fill this out. See you next time!  Shilo Philipson W. Tameshia Bonneville, PhD, ANP-BC [facility]       

## 2022-02-20 ENCOUNTER — Ambulatory Visit: Payer: BC Managed Care – PPO | Admitting: Gastroenterology

## 2022-02-20 ENCOUNTER — Encounter: Payer: Self-pay | Admitting: Gastroenterology

## 2022-02-20 VITALS — BP 124/70 | HR 88 | Temp 97.3°F | Ht 63.0 in | Wt 240.8 lb

## 2022-02-20 DIAGNOSIS — K642 Third degree hemorrhoids: Secondary | ICD-10-CM | POA: Diagnosis not present

## 2022-02-20 NOTE — Patient Instructions (Signed)
Please continue to avoid straining.  You should limit your toilet time to 2-3 minutes at the most.   Continue to avoid constipation.  Please call me with any concerns or issues!  I will see you in follow-up for additional banding in several weeks.    I enjoyed seeing you again today! As you know, I value our relationship and want to provide genuine, compassionate, and quality care. I welcome your feedback. If you receive a survey regarding your visit,  I greatly appreciate you taking time to fill this out. See you next time!  Ranata Laughery W. Didier Brandenburg, PhD, ANP-BC [facility]       

## 2022-03-06 ENCOUNTER — Encounter: Payer: BC Managed Care – PPO | Admitting: Gastroenterology

## 2022-03-10 ENCOUNTER — Telehealth: Payer: Self-pay

## 2022-03-10 MED ORDER — COLESTIPOL HCL 1 G PO TABS: 1.0000 g | ORAL_TABLET | Freq: Every day | ORAL | 1 refills | Status: DC

## 2022-03-10 NOTE — Telephone Encounter (Signed)
Refill request for Colestipol 1 gm tablets Qty: 90. Last ov was 02/20/2022.

## 2022-03-10 NOTE — Addendum Note (Signed)
Addended by: Gelene Mink on: 03/10/2022 03:26 PM   Modules accepted: Orders

## 2022-03-12 ENCOUNTER — Telehealth: Payer: BC Managed Care – PPO | Admitting: Physician Assistant

## 2022-03-12 DIAGNOSIS — J029 Acute pharyngitis, unspecified: Secondary | ICD-10-CM | POA: Diagnosis not present

## 2022-03-12 MED ORDER — AZITHROMYCIN 250 MG PO TABS: ORAL_TABLET | ORAL | 0 refills | Status: AC

## 2022-03-12 NOTE — Progress Notes (Signed)
E-Visit for Sore Throat - Strep Symptoms  We are sorry that you are not feeling well.  Here is how we plan to help!  Based on what you have shared with me it is likely that you have strep pharyngitis.  Strep pharyngitis is inflammation and infection in the back of the throat.  This is an infection cause by bacteria and is treated with antibiotics.  I have prescribed Azithromycin 250 mg two tablets today and then one daily for 4 additional days. For throat pain, we recommend over the counter oral pain relief medications such as acetaminophen or aspirin, or anti-inflammatory medications such as ibuprofen or naproxen sodium. Topical treatments such as oral throat lozenges or sprays may be used as needed. Strep infections are not as easily transmitted as other respiratory infections, however we still recommend that you avoid close contact with loved ones, especially the very young and elderly.  Remember to wash your hands thoroughly throughout the day as this is the number one way to prevent the spread of infection and wipe down door knobs and counters with disinfectant.   Home Care: Only take medications as instructed by your medical team. Complete the entire course of an antibiotic. Do not take these medications with alcohol. A steam or ultrasonic humidifier can help congestion.  You can place a towel over your head and breathe in the steam from hot water coming from a faucet. Avoid close contacts especially the very young and the elderly. Cover your mouth when you cough or sneeze. Always remember to wash your hands.  Get Help Right Away If: You develop worsening fever or sinus pain. You develop a severe head ache or visual changes. Your symptoms persist after you have completed your treatment plan.  Make sure you Understand these instructions. Will watch your condition. Will get help right away if you are not doing well or get worse.   Thank you for choosing an e-visit.  Your e-visit  answers were reviewed by a board certified advanced clinical practitioner to complete your personal care plan. Depending upon the condition, your plan could have included both over the counter or prescription medications.  Please review your pharmacy choice. Make sure the pharmacy is open so you can pick up prescription now. If there is a problem, you may contact your provider through MyChart messaging and have the prescription routed to another pharmacy.  Your safety is important to us. If you have drug allergies check your prescription carefully.   For the next 24 hours you can use MyChart to ask questions about today's visit, request a non-urgent call back, or ask for a work or school excuse. You will get an email in the next two days asking about your experience. I hope that your e-visit has been valuable and will speed your recovery.  I provided 5 minutes of non face-to-face time during this encounter for chart review and documentation.   

## 2022-03-24 DIAGNOSIS — Z79899 Other long term (current) drug therapy: Secondary | ICD-10-CM | POA: Diagnosis not present

## 2022-03-24 DIAGNOSIS — M130 Polyarthritis, unspecified: Secondary | ICD-10-CM | POA: Diagnosis not present

## 2022-03-24 DIAGNOSIS — M797 Fibromyalgia: Secondary | ICD-10-CM | POA: Diagnosis not present

## 2022-07-09 ENCOUNTER — Other Ambulatory Visit: Payer: Self-pay | Admitting: Gastroenterology

## 2022-07-09 NOTE — Telephone Encounter (Signed)
Pt request a refill on her Colestipol and her last ov was 01/2022 (banding procedure)

## 2022-07-10 ENCOUNTER — Other Ambulatory Visit: Payer: Self-pay | Admitting: Gastroenterology

## 2022-07-10 MED ORDER — COLESTIPOL HCL 1 G PO TABS: 1.0000 g | ORAL_TABLET | Freq: Every day | ORAL | 3 refills | Status: DC

## 2022-07-10 NOTE — Telephone Encounter (Signed)
From: [name] To: Office of Gelene Mink, NP Sent: 07/10/2022 10:20 AM EST Subject: Medication Renewal Request  Refills have been requested for the following medications:   colestipol (COLESTID) 1 g tablet Lessie Dings Priya Matsen]  Preferred pharmacy: [id number] - [place], [place] - [address] Delivery method: [employer]

## 2022-07-10 NOTE — Telephone Encounter (Signed)
Done

## 2022-08-04 DIAGNOSIS — Z111 Encounter for screening for respiratory tuberculosis: Secondary | ICD-10-CM | POA: Diagnosis not present

## 2022-08-14 ENCOUNTER — Telehealth: Payer: BC Managed Care – PPO | Admitting: Family Medicine

## 2022-08-14 DIAGNOSIS — B9789 Other viral agents as the cause of diseases classified elsewhere: Secondary | ICD-10-CM | POA: Diagnosis not present

## 2022-08-14 DIAGNOSIS — J019 Acute sinusitis, unspecified: Secondary | ICD-10-CM

## 2022-08-14 NOTE — Progress Notes (Signed)
E-Visit for Sinus Problems  We are sorry that you are not feeling well.  Here is how we plan to help!  Based on what you have shared with me it looks like you have sinusitis.  Sinusitis is inflammation and infection in the sinus cavities of the head.  Based on your presentation I believe you most likely have Acute Viral Sinusitis.This is an infection most likely caused by a virus. There is not specific treatment for viral sinusitis other than to help you with the symptoms until the infection runs its course.  You may use an oral decongestant such as Mucinex D or if you have glaucoma or high blood pressure use plain Mucinex. Saline nasal spray help and can safely be used as often as needed for congestion, I have prescribed: Fluticasone nasal spray two sprays in each nostril once a day if using Flonase already- please add Astelin spray as well.  Some authorities believe that zinc sprays or the use of Echinacea may shorten the course of your symptoms.  Sinus infections are not as easily transmitted as other respiratory infection, however we still recommend that you avoid close contact with loved ones, especially the very young and elderly.  Remember to wash your hands thoroughly throughout the day as this is the number one way to prevent the spread of infection!  Home Care: Only take medications as instructed by your medical team. Do not take these medications with alcohol. A steam or ultrasonic humidifier can help congestion.  You can place a towel over your head and breathe in the steam from hot water coming from a faucet. Avoid close contacts especially the very young and the elderly. Cover your mouth when you cough or sneeze. Always remember to wash your hands.  Get Help Right Away If: You develop worsening fever or sinus pain. You develop a severe head ache or visual changes. Your symptoms persist after you have completed your treatment plan.  Make sure you Understand these  instructions. Will watch your condition. Will get help right away if you are not doing well or get worse.   Thank you for choosing an e-visit.  Your e-visit answers were reviewed by a board certified advanced clinical practitioner to complete your personal care plan. Depending upon the condition, your plan could have included both over the counter or prescription medications.  Please review your pharmacy choice. Make sure the pharmacy is open so you can pick up prescription now. If there is a problem, you may contact your provider through [employer] and have the prescription routed to another pharmacy.  Your safety is important to [place]. If you have drug allergies check your prescription carefully.   For the next 24 hours you can use MyChart to ask questions about today's visit, request a non-urgent call back, or ask for a work or school excuse. You will get an email in the next two days asking about your experience. I hope that your e-visit has been valuable and will speed your recovery.  I provided 5 minutes of non face-to-face time during this encounter for chart review, medication and order placement, as well as and documentation.

## 2022-11-26 ENCOUNTER — Telehealth: Payer: BC Managed Care – PPO | Admitting: Nurse Practitioner

## 2022-11-26 DIAGNOSIS — N3000 Acute cystitis without hematuria: Secondary | ICD-10-CM

## 2022-11-26 MED ORDER — NITROFURANTOIN MONOHYD MACRO 100 MG PO CAPS: 100.0000 mg | ORAL_CAPSULE | Freq: Two times a day (BID) | ORAL | 0 refills | Status: AC

## 2022-11-26 NOTE — Progress Notes (Signed)

## 2023-06-23 DIAGNOSIS — M79671 Pain in right foot: Secondary | ICD-10-CM | POA: Diagnosis not present

## 2023-07-13 ENCOUNTER — Telehealth: Payer: BC Managed Care – PPO | Admitting: Physician Assistant

## 2023-07-13 DIAGNOSIS — J329 Chronic sinusitis, unspecified: Secondary | ICD-10-CM | POA: Diagnosis not present

## 2023-07-13 MED ORDER — DOXYCYCLINE HYCLATE 100 MG PO TABS: 100.0000 mg | ORAL_TABLET | Freq: Two times a day (BID) | ORAL | 0 refills | Status: AC

## 2023-07-13 NOTE — Progress Notes (Signed)

## 2023-07-13 NOTE — Progress Notes (Signed)
I have spent 5 minutes in review of e-visit questionnaire, review and updating patient chart, medical decision making and response to patient.   Laure Kidney, PA-C

## 2023-07-23 ENCOUNTER — Telehealth: Payer: BC Managed Care – PPO | Admitting: Emergency Medicine

## 2023-07-23 DIAGNOSIS — N760 Acute vaginitis: Secondary | ICD-10-CM | POA: Diagnosis not present

## 2023-07-23 MED ORDER — FLUCONAZOLE 150 MG PO TABS: 150.0000 mg | ORAL_TABLET | Freq: Once | ORAL | 0 refills | Status: AC

## 2023-07-23 NOTE — Progress Notes (Signed)

## 2023-08-30 ENCOUNTER — Other Ambulatory Visit: Payer: Self-pay | Admitting: Gastroenterology

## 2023-08-31 NOTE — Telephone Encounter (Signed)
PT NEED OV

## 2023-09-17 ENCOUNTER — Encounter: Payer: Self-pay | Admitting: Gastroenterology

## 2023-09-17 ENCOUNTER — Ambulatory Visit: Payer: BC Managed Care – PPO | Admitting: Gastroenterology

## 2023-09-17 VITALS — BP 115/76 | HR 69 | Temp 97.8°F | Ht 63.0 in | Wt 226.6 lb

## 2023-09-17 DIAGNOSIS — K529 Noninfective gastroenteritis and colitis, unspecified: Secondary | ICD-10-CM

## 2023-09-17 DIAGNOSIS — R197 Diarrhea, unspecified: Secondary | ICD-10-CM | POA: Diagnosis not present

## 2023-09-17 DIAGNOSIS — K219 Gastro-esophageal reflux disease without esophagitis: Secondary | ICD-10-CM | POA: Diagnosis not present

## 2023-09-17 NOTE — Progress Notes (Signed)
Gastroenterology Office Note     Primary Care Physician:  Jaclynn Guarneri, FNP  Primary Gastroenterologist: Dr. Jena Gauss    Chief Complaint   Chief Complaint  Patient presents with   Follow-up    Pt arrives for follow up. Pt recently received refills on meds.      History of Present Illness   Tiffany Jackson is a 43 y.o. female presenting today with a history of GERD, hemorrhoids s/p banding, chronic intermittent loose stool following cholecystectomy and on Colestid historically, last seen in office June 2023, now with need for refills.   Rare dicyclomine. Once in a blue moon. Colestid 1 gram tablet daily. Can tell if she forgot. BM daily. No GERD issues. Rare Tums every now and then depending on food choices. In process of evaluation for donating kidney to father. Purposeful weight loss. She has no other concerns today.   colonoscopy Nov 2019: non-bleeding internal hemorrhoids, diverticulosis in entire examined colon.      Past Medical History:  Diagnosis Date   Allergy    Seasonal   Anxiety    Chicken pox    Depression    Fibromyalgia    Frequent headaches    GERD (gastroesophageal reflux disease)    Hypertension    Migraines     Past Surgical History:  Procedure Laterality Date   CESAREAN SECTION     2003, 2005, 2009   CHOLECYSTECTOMY N/A 04/04/2015   Procedure: LAPAROSCOPIC CHOLECYSTECTOMY;  Surgeon: Natale Lay, MD;  Location: [facility] ORS;  Service: General;  Laterality: N/A;   COLONOSCOPY WITH PROPOFOL N/A 07/15/2018   non-bleeding internal hemorrhoids, diverticulosis in entire examined colon.     Current Outpatient Medications  Medication Sig Dispense Refill   albuterol (PROVENTIL HFA;VENTOLIN HFA) 108 (90 BASE) MCG/ACT inhaler Inhale 2 puffs into the lungs as needed for wheezing or shortness of breath. 1 Inhaler 1   atorvastatin (LIPITOR) 10 MG tablet Take by mouth.     colestipol (COLESTID) 1 g tablet TAKE 1 TABLET(1 GRAM) BY MOUTH DAILY 90 tablet 0    dicyclomine (BENTYL) 20 MG tablet Take by mouth.     DULoxetine (CYMBALTA) 60 MG capsule Take 60 mg by mouth daily.     hydrocortisone 2.5 % cream Apply topically.     melatonin 5 MG TABS Take 5 mg by mouth at bedtime.     Multiple Vitamin (MULTIVITAMIN) tablet Take 1 tablet by mouth daily.     omega-3 fish oil (MAXEPA) 1000 MG CAPS capsule Take 1 capsule by mouth daily.     propranolol ER (INDERAL LA) 60 MG 24 hr capsule Take 60 mg by mouth daily.     spironolactone (ALDACTONE) 25 MG tablet Take 25 mg by mouth 2 (two) times daily.      venlafaxine XR (EFFEXOR-XR) 150 MG 24 hr capsule Take 150 mg by mouth daily with breakfast.     Vitamin D, Cholecalciferol, 1000 UNITS TABS Take 1,000 Units by mouth daily.      No current facility-administered medications for this visit.    Allergies as of 09/17/2023 - Review Complete 09/17/2023  Allergen Reaction Noted   Augmentin [amoxicillin-pot clavulanate] Other (See Comments) 12/30/2016    Family History  Problem Relation Age of Onset   Multiple sclerosis Mother    Hypertension Father    Cancer Maternal Grandmother 30       Breast Cancer   Cancer Paternal Grandfather        Lung Cancer  Anuerysm Paternal Grandmother    Heart disease Daughter    Colon cancer Neg Hx    Gastric cancer Neg Hx    Esophageal cancer Neg Hx    Crohn's disease Neg Hx    Ulcerative colitis Neg Hx     Social History   Socioeconomic History   Marital status: Married    Spouse name: Not on file   Number of children: Not on file   Years of education: Not on file   Highest education level: Not on file  Occupational History   Not on file  Tobacco Use   Smoking status: Former    Current packs/day: 0.50    Average packs/day: 0.5 packs/day for 12.0 years (6.0 ttl pk-yrs)    Types: Cigarettes   Smokeless tobacco: Never  Vaping Use   Vaping status: Some Days  Substance and Sexual Activity   Alcohol use: No    Alcohol/week: 0.0 standard drinks of alcohol    Drug use: No   Sexual activity: Yes    Partners: Male    Birth control/protection: Surgical    Comment: Husband  Other Topics Concern   Not on file  Social History Narrative   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    Pets: Dogs, cats, turtle, and snake    Outside pets: chickens, ducks    Caffeine- Cut out caffeine in diet    Social Drivers of Health   Financial Resource Strain: Low Risk  (04/01/2021)   Overall Financial Resource Strain (CARDIA)    Difficulty of Paying Living Expenses: Not hard at all  Food Insecurity: No Food Insecurity (04/01/2021)   Hunger Vital Sign    Worried About Running Out of Food in the Last Year: Never true    Ran Out of Food in the Last Year: Never true  Transportation Needs: No Transportation Needs (04/01/2021)   PRAPARE - Administrator, Civil Service (Medical): No    Lack of Transportation (Non-Medical): No  Physical Activity: Inactive (04/01/2021)   Exercise Vital Sign    Days of Exercise per Week: 0 days    Minutes of Exercise per Session: 0 min  Stress: Stress Concern Present (04/01/2021)   [employer] of Occupational Health - Occupational Stress Questionnaire    Feeling of Stress : Very much  Social Connections: Socially Integrated (04/01/2021)   Social Connection and Isolation Panel [NHANES]    Frequency of Communication with Friends and Family: Three times a week    Frequency of Social Gatherings with Friends and Family: Twice a week    Attends Religious Services: More than 4 times per year    Active Member of [employer] or Organizations: Yes    Attends Engineer, structural: More than 4 times per year    Marital Status: Married  Catering manager Violence: Not At Risk (04/01/2021)   Humiliation, Afraid, Rape, and Kick questionnaire    Fear of Current or Ex-Partner: No    Emotionally Abused: No    Physically Abused: No    Sexually Abused: No     Review of Systems   Gen: Denies any fever, chills, fatigue, weight loss, lack of  appetite.  CV: Denies chest pain, heart palpitations, peripheral edema, syncope.  Resp: Denies shortness of breath at rest or with exertion. Denies wheezing or cough.  GI: Denies dysphagia or odynophagia. Denies jaundice, hematemesis, fecal incontinence. GU : Denies urinary burning, urinary frequency, urinary hesitancy MS: Denies joint pain, muscle weakness, cramps, or limitation of movement.  Derm: Denies rash, itching, dry skin Psych: Denies depression, anxiety, memory loss, and confusion Heme: Denies bruising, bleeding, and enlarged lymph nodes.   Physical Exam   BP 115/76   Pulse 69   Temp 97.8 F (36.6 C)   Ht 5\' 3"  (1.6 m)   Wt 226 lb 9.6 oz (102.8 kg)   LMP 10/02/2020 (Approximate)   BMI 40.14 kg/m  General:   Alert and oriented. Pleasant and cooperative. Well-nourished and well-developed.  Head:  Normocephalic and atraumatic. Eyes:  Without icterus Abdomen:  +BS, soft, non-tender and non-distended. No HSM noted. No guarding or rebound. No masses appreciated.  Rectal:  Deferred  Msk:  Symmetrical without gross deformities. Normal posture. Extremities:  Without edema. Neurologic:  Alert and  oriented x4;  grossly normal neurologically. Skin:  Intact without significant lesions or rashes. Psych:  Alert and cooperative. Normal mood and affect.   Assessment   Tiffany Jackson is a 43 y.o. female presenting today with a history of GERD, hemorrhoids s/p banding, chronic intermittent loose stool following cholecystectomy for routine follow-up for refills.  Diarrhea following cholecystectomy: responded wonderfully to Colestid. Continue this. Colonoscopy on file from 2019. Next screening at age 65.  GERD: managed with diet/behavior. Continue excellent weight loss efforts.    PLAN    Colestid 1 gram daily Return in 1 year Colonoscopy age 38   Gelene Mink, PhD, ANP-BC [facility]

## 2023-09-17 NOTE — Patient Instructions (Signed)
I am glad you are doing well!  We will see you back in 1 year or sooner if needed!   I enjoyed seeing you again today! I value our relationship and want to provide genuine, compassionate, and quality care. You may receive a survey regarding your visit with me, and I welcome your feedback! Thanks so much for taking the time to complete this. I look forward to seeing you again.      Gelene Mink, PhD, ANP-BC [facility]

## 2024-01-05 ENCOUNTER — Other Ambulatory Visit: Payer: Self-pay | Admitting: Gastroenterology

## 2024-03-15 ENCOUNTER — Other Ambulatory Visit: Payer: Self-pay | Admitting: Gastroenterology

## 2024-06-03 ENCOUNTER — Ambulatory Visit (INDEPENDENT_AMBULATORY_CARE_PROVIDER_SITE_OTHER): Admitting: Obstetrics & Gynecology

## 2024-06-03 ENCOUNTER — Encounter: Payer: Self-pay | Admitting: Obstetrics & Gynecology

## 2024-06-03 ENCOUNTER — Other Ambulatory Visit (HOSPITAL_COMMUNITY)
Admission: RE | Admit: 2024-06-03 | Discharge: 2024-06-03 | Disposition: A | Discharge status: Home / Self Care | Payer: Self-pay | Source: Ambulatory Visit | Attending: Obstetrics & Gynecology | Admitting: Obstetrics & Gynecology

## 2024-06-03 VITALS — BP 124/84 | HR 76 | Ht 63.0 in | Wt 230.8 lb

## 2024-06-03 DIAGNOSIS — Z01419 Encounter for gynecological examination (general) (routine) without abnormal findings: Secondary | ICD-10-CM

## 2024-06-03 DIAGNOSIS — Z1151 Encounter for screening for human papillomavirus (HPV): Secondary | ICD-10-CM

## 2024-06-03 DIAGNOSIS — N941 Unspecified dyspareunia: Secondary | ICD-10-CM

## 2024-06-03 DIAGNOSIS — Z1331 Encounter for screening for depression: Secondary | ICD-10-CM

## 2024-06-03 DIAGNOSIS — N952 Postmenopausal atrophic vaginitis: Secondary | ICD-10-CM

## 2024-06-03 MED ORDER — ESTRADIOL 0.1 MG/GM VA CREA: 1.0000 g | TOPICAL_CREAM | VAGINAL | 11 refills | Status: AC

## 2024-06-03 NOTE — Progress Notes (Signed)
 WELL-WOMAN EXAMINATION Patient name: Tiffany Jackson MRN [id number]  Date of birth: [birth year] Chief Complaint:   No chief complaint on file.  History of Present Illness:   Tiffany Jackson is a 43 y.o. [phone] female being seen today for a routine well-woman exam.   Last menses was 2 years ago.  She denies any vaginal bleeding or discharge.  Denies pelvic or abdominal pain.  Previously noted hot flashes and night sweats but that has since resolved.  She continues to struggle with dyspareunia and vaginal discomfort and dryness.  She is currently using water-based lubricants with some relief  Also notes considerable mood swings-currently taking Effexor  and Cymbalta.   Patient's last menstrual period was 10/02/2020 (approximate).  The current method of family planning is post menopausal status.    Last pap 04/2021.  Last mammogram: [facility]. Last colonoscopy: NA     06/03/2024    9:15 AM 04/01/2021    2:48 PM 10/09/2016    9:43 AM 09/14/2015    1:37 PM  Depression screen PHQ 2/9  Decreased Interest 1 1 0 0  Down, Depressed, Hopeless 1 0 0 0  PHQ - 2 Score 2 1 0 0  Altered sleeping 3 3    Tired, decreased energy 2 2    Change in appetite 0 2    Feeling bad or failure about yourself  0 1    Trouble concentrating 1 0    Moving slowly or fidgety/restless 0 1    Suicidal thoughts 0 0    PHQ-9 Score 8 10        Review of Systems:   Pertinent items are noted in HPI Denies any headaches, blurred vision, fatigue, shortness of breath, chest pain, abdominal pain, bowel movements, urination, or intercourse unless otherwise stated above.  Pertinent History Reviewed:  Reviewed past medical,surgical, social and family history.  Reviewed problem list, medications and allergies. Physical Assessment:   Vitals:   06/03/24 0916  BP: 124/84  Pulse: 76  Weight: 230 lb 12.8 oz (104.7 kg)  Height: 5' 3 (1.6 m)  Body mass index is 40.88 kg/m.        Physical Examination:    General appearance - well appearing, and in no distress  Mental status - alert, oriented to person, place, and time  Psych:  She has a normal mood and affect  Skin - warm and dry, normal color, no suspicious lesions noted  Chest - effort normal, all lung fields clear to auscultation bilaterally  Heart - normal rate and regular rhythm  Neck:  midline trachea, no thyromegaly or nodules  Breasts - breasts appear normal, no suspicious masses, no skin or nipple changes or  axillary nodes  Abdomen -obese soft, nontender, nondistended, no masses or organomegaly  Pelvic - VULVA: normal appearing vulva with no masses, tenderness or lesions-some flatness of vaginal mucosa noted VAGINA: normal appearing vagina with normal color and discharge, no lesions  CERVIX: normal appearing cervix without discharge or lesions, no CMT  Thin prep pap is done with HR HPV cotesting  UTERUS: uterus is felt to be normal size, shape, consistency and nontender   ADNEXA: No adnexal masses or tenderness noted-exam limited due to body habitus  Extremities:  No swelling or varicosities noted  Chaperone: Alan Fischer     Assessment & Plan:  1) Well-Woman Exam -Pap collected, reviewed ASCCP guidelines - Mammogram completed at outside facility  2) dyspareunia, vaginal atrophy - Discussed personal moisturizers - Plan to start on vaginal estrogen  therapy use weekly for 2 weeks then decrease to twice weekly usage  3) mood changes - Discussed herbal supplements as possible remedy especially since patient would prefer to avoid more medication and is already on SSRI  No orders of the defined types were placed in this encounter.   Meds:  Meds ordered this encounter  Medications   estradiol  (ESTRACE ) 0.1 MG/GM vaginal cream    Sig: Place 1 g vaginally 2 (two) times a week.    Dispense:  42.5 g    Refill:  11    Follow-up: Return in about 1 year (around 06/03/2025) for Annual.   Abrian Hanover, DO Attending  Obstetrician & Gynecologist, [facility] for [facility], [facility]

## 2024-06-06 LAB — CYTOLOGY - PAP
Adequacy: ABSENT
Comment: NEGATIVE
Diagnosis: NEGATIVE
High risk HPV: NEGATIVE

## 2024-06-07 ENCOUNTER — Ambulatory Visit: Payer: Self-pay | Admitting: Obstetrics & Gynecology

## 2024-07-26 ENCOUNTER — Other Ambulatory Visit: Payer: Self-pay | Admitting: Gastroenterology

## 2024-08-31 ENCOUNTER — Encounter: Payer: Self-pay | Admitting: Gastroenterology
